# Patient Record
Sex: Female | Born: 1960 | Race: White | Hispanic: No | Marital: Married | Smoking: Former smoker
Health system: Southern US, Community
[De-identification: ages and names within clinical notes are randomized; demographics above are authoritative.]

## PROBLEM LIST (undated history)

## (undated) DIAGNOSIS — Z9889 Other specified postprocedural states: Secondary | ICD-10-CM

## (undated) DIAGNOSIS — L409 Psoriasis, unspecified: Secondary | ICD-10-CM

## (undated) DIAGNOSIS — C801 Malignant (primary) neoplasm, unspecified: Secondary | ICD-10-CM

## (undated) DIAGNOSIS — B1920 Unspecified viral hepatitis C without hepatic coma: Secondary | ICD-10-CM

## (undated) DIAGNOSIS — IMO0001 Reserved for inherently not codable concepts without codable children: Secondary | ICD-10-CM

## (undated) DIAGNOSIS — F419 Anxiety disorder, unspecified: Secondary | ICD-10-CM

## (undated) DIAGNOSIS — Z923 Personal history of irradiation: Secondary | ICD-10-CM

## (undated) DIAGNOSIS — IMO0002 Reserved for concepts with insufficient information to code with codable children: Secondary | ICD-10-CM

## (undated) DIAGNOSIS — I8500 Esophageal varices without bleeding: Secondary | ICD-10-CM

## (undated) DIAGNOSIS — R112 Nausea with vomiting, unspecified: Secondary | ICD-10-CM

## (undated) DIAGNOSIS — F32A Depression, unspecified: Secondary | ICD-10-CM

## (undated) DIAGNOSIS — C50919 Malignant neoplasm of unspecified site of unspecified female breast: Secondary | ICD-10-CM

## (undated) DIAGNOSIS — Z973 Presence of spectacles and contact lenses: Secondary | ICD-10-CM

## (undated) HISTORY — PX: BREAST LUMPECTOMY: SHX2

## (undated) HISTORY — DX: Unspecified viral hepatitis C without hepatic coma: B19.20

## (undated) HISTORY — PX: UPPER GI ENDOSCOPY: SHX6162

## (undated) HISTORY — DX: Anxiety disorder, unspecified: F41.9

## (undated) HISTORY — DX: Malignant (primary) neoplasm, unspecified: C80.1

## (undated) HISTORY — DX: Reserved for concepts with insufficient information to code with codable children: IMO0002

## (undated) HISTORY — PX: BREAST SURGERY: SHX581

## (undated) HISTORY — PX: OTHER SURGICAL HISTORY: SHX169

## (undated) HISTORY — DX: Reserved for inherently not codable concepts without codable children: IMO0001

## (undated) HISTORY — PX: EXCISION / BIOPSY BREAST / NIPPLE / DUCT: SUR469

## (undated) HISTORY — DX: Depression, unspecified: F32.A

---

## 1998-01-01 ENCOUNTER — Ambulatory Visit (HOSPITAL_COMMUNITY): Admission: RE | Admit: 1998-01-01 | Discharge: 1998-01-01 | Payer: Self-pay | Admitting: Obstetrics and Gynecology

## 1998-01-06 ENCOUNTER — Ambulatory Visit (HOSPITAL_COMMUNITY): Admission: RE | Admit: 1998-01-06 | Discharge: 1998-01-06 | Payer: Self-pay | Admitting: Obstetrics and Gynecology

## 1998-06-15 ENCOUNTER — Other Ambulatory Visit: Admission: RE | Admit: 1998-06-15 | Discharge: 1998-06-15 | Payer: Self-pay | Admitting: Obstetrics and Gynecology

## 1999-09-13 ENCOUNTER — Other Ambulatory Visit: Admission: RE | Admit: 1999-09-13 | Discharge: 1999-09-13 | Payer: Self-pay | Admitting: Obstetrics and Gynecology

## 1999-09-18 ENCOUNTER — Encounter: Payer: Self-pay | Admitting: Obstetrics and Gynecology

## 1999-09-18 ENCOUNTER — Inpatient Hospital Stay (HOSPITAL_COMMUNITY): Admission: AD | Admit: 1999-09-18 | Discharge: 1999-09-19 | Payer: Self-pay | Admitting: Obstetrics and Gynecology

## 1999-09-19 HISTORY — PX: TUBAL LIGATION: SHX77

## 1999-09-23 ENCOUNTER — Encounter (INDEPENDENT_AMBULATORY_CARE_PROVIDER_SITE_OTHER): Payer: Self-pay

## 1999-09-23 ENCOUNTER — Other Ambulatory Visit: Admission: RE | Admit: 1999-09-23 | Discharge: 1999-09-23 | Payer: Self-pay | Admitting: Obstetrics and Gynecology

## 1999-11-02 ENCOUNTER — Inpatient Hospital Stay (HOSPITAL_COMMUNITY): Admission: AD | Admit: 1999-11-02 | Discharge: 1999-11-02 | Payer: Self-pay | Admitting: Obstetrics & Gynecology

## 1999-11-10 ENCOUNTER — Inpatient Hospital Stay (HOSPITAL_COMMUNITY): Admission: AD | Admit: 1999-11-10 | Discharge: 1999-11-13 | Payer: Self-pay | Admitting: Obstetrics & Gynecology

## 1999-12-21 ENCOUNTER — Other Ambulatory Visit: Admission: RE | Admit: 1999-12-21 | Discharge: 1999-12-21 | Payer: Self-pay | Admitting: Obstetrics and Gynecology

## 2001-01-28 ENCOUNTER — Other Ambulatory Visit: Admission: RE | Admit: 2001-01-28 | Discharge: 2001-01-28 | Payer: Self-pay | Admitting: Obstetrics and Gynecology

## 2001-08-10 ENCOUNTER — Encounter: Admission: RE | Admit: 2001-08-10 | Discharge: 2001-08-10 | Payer: Self-pay | Admitting: Family Medicine

## 2001-08-10 ENCOUNTER — Encounter: Payer: Self-pay | Admitting: Family Medicine

## 2001-08-29 ENCOUNTER — Ambulatory Visit (HOSPITAL_COMMUNITY): Admission: RE | Admit: 2001-08-29 | Discharge: 2001-08-29 | Payer: Self-pay | Admitting: Obstetrics and Gynecology

## 2001-08-29 ENCOUNTER — Encounter (INDEPENDENT_AMBULATORY_CARE_PROVIDER_SITE_OTHER): Payer: Self-pay | Admitting: *Deleted

## 2002-03-13 ENCOUNTER — Other Ambulatory Visit: Admission: RE | Admit: 2002-03-13 | Discharge: 2002-03-13 | Payer: Self-pay | Admitting: Obstetrics and Gynecology

## 2003-03-30 ENCOUNTER — Other Ambulatory Visit: Admission: RE | Admit: 2003-03-30 | Discharge: 2003-03-30 | Payer: Self-pay | Admitting: Obstetrics and Gynecology

## 2003-07-30 ENCOUNTER — Ambulatory Visit (HOSPITAL_COMMUNITY): Admission: RE | Admit: 2003-07-30 | Discharge: 2003-07-30 | Payer: Self-pay | Admitting: Gastroenterology

## 2003-07-30 ENCOUNTER — Encounter (INDEPENDENT_AMBULATORY_CARE_PROVIDER_SITE_OTHER): Payer: Self-pay | Admitting: *Deleted

## 2004-05-02 ENCOUNTER — Other Ambulatory Visit: Admission: RE | Admit: 2004-05-02 | Discharge: 2004-05-02 | Payer: Self-pay | Admitting: Obstetrics and Gynecology

## 2004-09-27 ENCOUNTER — Encounter: Admission: RE | Admit: 2004-09-27 | Discharge: 2004-09-27 | Payer: Self-pay | Admitting: Obstetrics and Gynecology

## 2005-01-25 ENCOUNTER — Encounter (INDEPENDENT_AMBULATORY_CARE_PROVIDER_SITE_OTHER): Payer: Self-pay | Admitting: Specialist

## 2005-01-25 ENCOUNTER — Ambulatory Visit (HOSPITAL_COMMUNITY): Admission: RE | Admit: 2005-01-25 | Discharge: 2005-01-25 | Payer: Self-pay | Admitting: Gastroenterology

## 2005-04-04 ENCOUNTER — Ambulatory Visit: Payer: Self-pay | Admitting: Gastroenterology

## 2005-05-19 ENCOUNTER — Other Ambulatory Visit: Admission: RE | Admit: 2005-05-19 | Discharge: 2005-05-19 | Payer: Self-pay | Admitting: Obstetrics and Gynecology

## 2006-05-18 ENCOUNTER — Encounter: Admission: RE | Admit: 2006-05-18 | Discharge: 2006-05-18 | Payer: Self-pay | Admitting: Obstetrics and Gynecology

## 2006-06-28 ENCOUNTER — Encounter: Admission: RE | Admit: 2006-06-28 | Discharge: 2006-06-28 | Payer: Self-pay | Admitting: Obstetrics and Gynecology

## 2007-02-14 ENCOUNTER — Encounter: Admission: RE | Admit: 2007-02-14 | Discharge: 2007-02-14 | Payer: Self-pay | Admitting: Obstetrics and Gynecology

## 2007-06-27 ENCOUNTER — Other Ambulatory Visit: Admission: RE | Admit: 2007-06-27 | Discharge: 2007-06-27 | Payer: Self-pay | Admitting: Obstetrics and Gynecology

## 2008-06-09 ENCOUNTER — Encounter: Admission: RE | Admit: 2008-06-09 | Discharge: 2008-06-09 | Payer: Self-pay | Admitting: Obstetrics and Gynecology

## 2009-08-04 ENCOUNTER — Encounter: Admission: RE | Admit: 2009-08-04 | Discharge: 2009-08-04 | Payer: Self-pay | Admitting: Obstetrics and Gynecology

## 2009-08-17 ENCOUNTER — Encounter: Admission: RE | Admit: 2009-08-17 | Discharge: 2009-08-17 | Payer: Self-pay | Admitting: Obstetrics and Gynecology

## 2010-02-24 ENCOUNTER — Other Ambulatory Visit: Admission: RE | Admit: 2010-02-24 | Discharge: 2010-02-24 | Payer: Self-pay | Admitting: Internal Medicine

## 2010-09-02 ENCOUNTER — Encounter
Admission: RE | Admit: 2010-09-02 | Discharge: 2010-09-02 | Payer: Self-pay | Source: Home / Self Care | Attending: Obstetrics and Gynecology | Admitting: Obstetrics and Gynecology

## 2010-10-08 ENCOUNTER — Encounter: Payer: Self-pay | Admitting: Obstetrics and Gynecology

## 2010-10-09 ENCOUNTER — Encounter: Payer: Self-pay | Admitting: Obstetrics and Gynecology

## 2011-01-04 ENCOUNTER — Other Ambulatory Visit: Payer: Self-pay

## 2011-02-03 NOTE — Discharge Summary (Signed)
[facility]  Patient:    Connie [name], Connie [name]                        MRN: [id number] Adm. Date:  [id number] Disc. Date: [id number] Attending:  Minette Headland Dictator:   Danie Chandler, R.N.                           Discharge Summary  ADMISSION DIAGNOSES:          1. Intrauterine pregnancy at [redacted] weeks gestation.                               2. Surgically scarred uterus.                               3. Request repeat cesarean delivery.                               4. Multiparity.  DISCHARGE DIAGNOSES:          1. Intrauterine pregnancy at [redacted] weeks gestation.                               2. Surgically scarred uterus.                               3. Request repeat cesarean delivery.                               4. Multiparity.  PROCEDURE:                    On November 10, 1999, repeat low transverse cesarean section and bilateral tubal ligation.  REASON FOR ADMISSION:         The patient is a 50 year old married white female, gravida 3, para 1, who delivered the first time by cesarean section and was scheduled for repeat cesarean section on November 25, 1999.  The patient presented n labor at approximately 36 to 36-1/[redacted] weeks pregnant.  The only prenatal complication was that she acquired hepatitic C while pregnant.  HOSPITAL COURSE:              The patient is taken to the operating room and undergoes the above named procedure without complications.  This is productive f a viable female infant with Apgars of 8 at one minute and 9 at five minutes. Postoperatively, the patient does well.  On postoperative day #1, the patients hemoglobin was 11.9, hematocrit 35.4, and white blood cell count 16.4.  She had  good control of pain on this day.  Liver functions were followed during her hospitalization.  On November 11, 1999, SGOT was 95 and SGPT was 85.  This patient does have known hepatitis C.  On postoperative day #2, she had a good return  of  bowel function and was tolerating a regular diet and was also ambulating well without difficulty.  SGOT was 134 and SGPT was 103.  The patient remained stable and was discharged home on postoperative day #3.  CONDITION ON DISCHARGE:  Good.  DIET:                         Regular as tolerated.  ACTIVITY:                     No heavy lifting, no driving, and no vaginal entry.  FOLLOW-UP:                    She is to follow up in one to two weeks for incision check and she is to call for a temperature greater than 100 degrees, persistent  nausea and vomiting, heavy vaginal bleeding, and/or redness or drainage from the incision site.  She is also to follow up with Verlin Grills, M.D. in relation  to hepatitis C.  DISCHARGE MEDICATIONS:        1. Prenatal vitamins one p.o. q.d.                               2. Demerol 50 mg one to two p.o. q.4-6h. p.r.n. pain. DD:  11/23/99 TD:  11/24/99 Job: [id number]

## 2011-02-03 NOTE — H&P (Signed)
[facility]  PatientAmethyst [name], [place] Visit Number: [id number] MRN: [id number]          Service Type: Attending:  Guy Sandifer. Arleta Creek, M.D. Dictated by:   Guy Sandifer Arleta Creek, M.D. Adm. Date:  08/29/01                           History and Physical  CHIEF COMPLAINT:              Irregular menses.  HISTORY OF PRESENT ILLNESS:   This patient is a 50 year old married white female, G3, P3, status post tubal ligation, with increasing irregular menses, sometimes having two in one months.  On physical examination, uterus is normal size and nontender.  On pelvic ultrasound on August 23, 2001, the uterus measured 7.79 x 3.63 x 5.29 cm.  One intramural leiomyoma measuring 1.5 cm is noted.  The ovaries are normal.  Sonohistogram reveals a polypoid-type structure within the endometrial cavity.  Hysteroscopy with resectoscope and D&C is discussed with the patient.  She is being admitted for this surgery.  PAST MEDICAL HISTORY:         1.  Hepatitis C, taken care of by                                   Dr. Danise Edge.                               2.  History of vulvar condyloma.                               3.  History of psoriasis.  PAST SURGICAL HISTORY:        Negative.  OBSTETRIC HISTORY:            Cesarean section x 2, TAB x 1.  FAMILY HISTORY:               Cardiomegaly in mother, coronary artery disease in father, chronic hypertension in mother and father, diabetes in father, thyroid dysfunction in mother.  MEDICATIONS:                  None.  ALLERGIES:                    CODEINE leading to nausea and vomiting.  SOCIAL HISTORY:               Patient has recently stopped smoking.  Denies alcohol or drug abuse.  REVIEW OF SYSTEMS:            Negative, except as above.  PHYSICAL EXAMINATION:  VITAL SIGNS:                  Height is 5 feet 6 inches, weight is 151 pounds. Blood pressure 110/68.  HEENT:                        Without  thyromegaly.  LUNGS:                        Clear to auscultation.  HEART:  Regular rate and rhythm.  BACK:                         Without CVA tenderness.  BREASTS:                      Without mass, retraction, or discharge.  ABDOMEN:                      Soft and nontender, without masses.  PELVIC:                       Vulva, vagina, and cervix without lesions. Uterus normal size and nontender.  Adnexa nontender, without masses.  EXTREMITIES:                  Grossly within normal limits.  NEUROLOGICAL:                 Grossly within normal limits.  ASSESSMENT:                   Irregular menses.  PLAN:                         Hysteroscopy, resectoscope, D&C. Dictated by:   Guy Sandifer Arleta Creek, M.D. Attending:  Guy Sandifer Arleta Creek, M.D. DD:  08/28/01 TD:  08/28/01 Job: [id number]

## 2011-02-03 NOTE — Discharge Summary (Signed)
[facility]  Patient:    Connie [name], Connie [name]                        MRN: [id number] Adm. Date:  [id number] Disc. Date: [id number] Attending:  Minette Headland CC:         [facility]             Verlin Grills, M.D.                           Discharge Summary  HISTORY OF PRESENT ILLNESS:   Patient is a 50 year old gravida 3, para 1, TAB 1, white female, EDC December 06, 1999, 28-5/7 weeks.  Patient was seen in the office the week prior to admission complaining of heartburn.  She manifested no preeclampsia symptoms.  Preeclampsia laboratory studies were performed, but the laboratory could not locate them.  They were ultimately able to locate them on the day of admission, September 17, 1999.  The patient had been in the computer by a different name. er liver function tests on December 22 revealed an SGOT of 66 and SGPT of 93.  The  patient was urged to come to maternity admissions for evaluation.  At maternity  admissions liver function tests remained elevated at SGOT of 73 and SGPT of 92.  The patient had manifested severe heartburn the day of admission accompanied by  nausea and vomiting.  She denied any other preeclampsia symptoms.  She did have a cesarean section in 1989 and did not have preeclampsia, but this is a different  father.  With respect to gastroenterological reviews patient has well water.  She has eaten no raw seafood or meat.  She did have some tattoos placed six years previously.  She denied IV drug use or blood transfusion.  She also denies any viral symptoms. History of two beers per day prior to pregnancy, no alcohol since having a positive pregnancy test.  Patient did have a bowel movement today, has taken no Tylenol or the last three days.  PAST MEDICAL HISTORY:         TAB in 1976, cesarean section in 1989, history of  psoriasis.  She had been treated for psoriasis with methotrexate but has  not taken this for years.  MEDICATIONS:                  Prenatal vitamins.  ALLERGIES:                    CODEINE causes nausea and nightmares.  SOCIAL HISTORY:               The patient is a Merchandiser, retail at [employer] and is a one half pack per day smoker.  PHYSICAL EXAMINATION:  GENERAL:                      Well-developed, well-nourished white female in no  acute distress.  VITAL SIGNS:                  Blood pressure 111/67.  LUNGS:                        Clear to auscultation.  CARDIAC:                      Regular rate and rhythm.  ABDOMEN:  Fundus nontender.  Abdomen soft, nontender.  No hepatosplenomegaly.  No right upper quadrant tenderness.  EXTREMITIES:                  DTRs 1+, no clonus.  There was noted to be a trace of pedal edema.  PELVIC:                       Pelvic examination was deferred.  LABORATORY STUDIES:           White count 12,200, hemoglobin 11.7, hematocrit 33.8, 257,000 platelet count.  SGOT was 73, SGPT 92, LDH 143, uric acid 3.9, urinalysis negative for protein.  HOSPITAL COURSE:              The patient was admitted with elevated liver function tests with no additional symptoms of preeclampsia.  The patient was admitted to  rule out preeclampsia and had a 24 hour urine for protein and creatinine clearance performed.  It was this physicians clinical suspicion that the patients primary  problem was hepatic and not preeclampsia, so an acute hepatitis profile was drawn. In addition, lipase and amylase were also performed.  Right upper quadrant ultrasound to rule out cholelithiasis.  Patient subsequently underwent an ultrasound for fetal growth.  The ultrasound showed normal fetal growth with normal fluid and normal umbilical Doppler studies.  Right upper quadrant ultrasound was also negative.  Lipase returned as normal at 32, amylase was 37.  Twenty-four hour urine revealed a creatinine clearance of 210  ml/minute which is normal in pregnancy.  There were 51 mg in a 24 hour collection which was not proteinuric.  Repeat hemoglobin was 11.4, platelet count was 263,000.  SGOT was 68 and SGPT was 89.  Uric acid was 2.8 and LDH was 130.  The acute hepatitis profile returned and the patient was noted to have positive anti HCV, Hepatitis C antibody.  The patients anti AJV was negative, HB core IGM was negative, and HBSAG was negative. The patient was discussed with Dr. Danise Edge, gastroenterologist on call for [facility].  He suggested a confirmatory quantitative HCVRNA for _________ chain reaction to assess her viral load.  Patient was subsequently discharged home, urged to follow up in the office.  She was made aware that if  confirmatory quantitative HCVRNA _________ chain reaction were positive, that she would be referred to Dr. Laural Benes for further evaluation and management. Patient was very amenable with this plan.  She was subsequently discharged home on September 19, 1999. DD:  11/20/99 TD:  11/21/99 Job: [id number]

## 2011-02-03 NOTE — Op Note (Signed)
[facility]  Patient:    Connie [name], Connie [name] [facility] Visit Number: [id number] MRN: [id number]          Service Type: DSU Location: [facility] Attending Physician:  Soledad Gerlach Dictated by:   Guy Sandifer Arleta Creek, M.D. Proc. Date: 08/29/01 Admit Date:  08/29/2001                             Operative Report  PREOPERATIVE DIAGNOSES:       Irregular menses.  POSTOPERATIVE DIAGNOSES:      Irregular menses.  PROCEDURE:                    Hysteroscopy, dilatation and curettage.  SURGEON:                      Guy Sandifer. Arleta Creek, M.D.  ANESTHESIA:                   General with LMA.  ESTIMATED BLOOD LOSS:         Drops.  INPUT AND OUTPUT:             Sorbitol distending media 40 cc deficit.  INDICATIONS AND CONSENTS:     Patient is a 50 year old married white female G3, P3 status post tubal ligation with increasingly irregular menses.  Details are dictated in the history and physical.  Hysteroscopy with D&C is discussed with the patient.  Potential risks and complications have been discussed including, but not limited to, infection, uterine perforation, organ damage, bowel, bladder, ureteral damage, bleeding requiring transfusion of blood products with possible transfusion reaction, HIV and hepatitis acquisition, DVT, PE, pneumonia, laparoscopy, laparotomy, and hysterectomy.  All questions have been answered and consent is signed on the chart.  FINDINGS:                     The uterine cavity is normal.  No abnormal structures or vessels are noted.  Fallopian tube and ostia are identified bilaterally.  PROCEDURE:                    Patient was taken to the operating room, placed in dorsal supine position where general anesthesia via LMA is induced.  She is then placed in the dorsal lithotomy position where she is prepped, bladder straight catheterized, and she is draped in a sterile fashion.  Vivelle speculum was placed in the vagina.  The anterior  cervical lip is injected with 1% Xylocaine and grasped with a single tooth tenaculum.  Paracervical block was placed at the 2, 4, 5, 7, 8, 10 oclock positions with 1% Xylocaine. Cervix is gently, progressively dilated to a 29 Pratt dilator.  Diagnostic hysteroscope is then placed in the cervix and advanced under direct visualization using sorbitol distending media.  The above findings are noted. The hysteroscope is withdrawn and sharp curettage is carried out. Re-inspection with the hysterscope again reveals no abnormal structures. Procedure is terminated.  Instruments are removed.  Good hemostasis is noted. All counts are correct.  The patient is awakened, taken to recovery room in stable condition. Dictated by:   Guy Sandifer Arleta Creek, M.D. Attending Physician:  Soledad Gerlach DD:  08/29/01 TD:  08/29/01 Job: [id number]

## 2011-02-03 NOTE — Op Note (Signed)
   NAME:  Connie [name], Connie [name]                            ACCOUNT NO.:  [id number]   MEDICAL RECORD NO.:  [id number]                   PATIENT TYPE:  AMB   LOCATION:  ENDO                                 FACILITY:  [facility]   PHYSICIAN:  Danise Edge, M.D.                DATE OF BIRTH:  [birth year]   DATE OF PROCEDURE:  07/30/2003  DATE OF DISCHARGE:                                 OPERATIVE REPORT   PROCEDURE:  Colonoscopy.   INDICATIONS FOR PROCEDURE:  Ms. Connie [name] is a 50 year old female born 06-12-1961.  Ms. Connie [name] is undergoing diagnostic colonoscopy to evaluate  intermittent pain with hematochezia.  Ms. Connie [name] has asymptomatic viral  hepatitis [name] (type 1A).  On January 04, 2001, her liver profile revealed less  than 2 elevation in her transaminases.   ENDOSCOPIST:  Charolett Bumpers, M.D.   PREMEDICATION:  Versed 10 mg, Fentanyl 100 mcg.   PROCEDURE:  After obtaining confirmed consent, Connie [name] was placed in the  left lateral decubitus position.  I administered intravenous Fentanyl and  intravenous Versed to achieve conscious sedation for the procedure.  The  patient's blood pressure, oxygen saturation, and cardiac rhythm were  monitored throughout the procedure and documented in the medical record.  Anal inspection was normal.  Digital rectal exam was normal.  The Olympus  pediatric video colonoscope was introduced into the rectum and advanced to  the cecum.  Colonic preparation for the exam today was excellent.   Rectum normal.  Sigmoid colon and ascending colon:  From the distal sigmoid colon at  approximately 15 cm from the anal verge, a 2 cm pedunculated polyp was  removed with electrocautery snare and submitted for pathologic  interpretation.  Splenic flexure normal.  Transverse colon normal.  Hepatic flexure normal.  Ascending colon normal.  Cecum and ileocecal valve normal.    ASSESSMENT:  In the distal sigmoid colon, a 2 cm pedunculated polyp was  removed with  electrocautery snare and submitted for pathologic  interpretation.   RECOMMENDATIONS:  Liver profile is drawn in the [facility] endoscopy suite  today.                                               Danise Edge, M.D.    MJ/MEDQ  D:  07/30/2003  T:  07/30/2003  Job:  [id number]   cc:   Guy Sandifer. Arleta Creek, M.D.  [address]  [place]  [place] [address]  Fax: [phone]

## 2011-11-22 ENCOUNTER — Encounter: Payer: Self-pay | Admitting: Family Medicine

## 2011-11-22 ENCOUNTER — Ambulatory Visit (INDEPENDENT_AMBULATORY_CARE_PROVIDER_SITE_OTHER): Payer: BC Managed Care – PPO | Admitting: Family Medicine

## 2011-11-22 DIAGNOSIS — B1920 Unspecified viral hepatitis C without hepatic coma: Secondary | ICD-10-CM | POA: Insufficient documentation

## 2011-11-22 DIAGNOSIS — R682 Dry mouth, unspecified: Secondary | ICD-10-CM

## 2011-11-22 DIAGNOSIS — Z23 Encounter for immunization: Secondary | ICD-10-CM

## 2011-11-22 DIAGNOSIS — L409 Psoriasis, unspecified: Secondary | ICD-10-CM | POA: Insufficient documentation

## 2011-11-22 DIAGNOSIS — Z0000 Encounter for general adult medical examination without abnormal findings: Secondary | ICD-10-CM

## 2011-11-22 DIAGNOSIS — Z780 Asymptomatic menopausal state: Secondary | ICD-10-CM

## 2011-11-22 LAB — CBC WITH DIFFERENTIAL/PLATELET
Basophils Absolute: 0.1 10*3/uL (ref 0.0–0.1)
Basophils Relative: 1 % (ref 0–1)
Eosinophils Absolute: 0.1 10*3/uL (ref 0.0–0.7)
Hemoglobin: 15.4 g/dL — ABNORMAL HIGH (ref 12.0–15.0)
MCH: 32.3 pg (ref 26.0–34.0)
MCHC: 33.3 g/dL (ref 30.0–36.0)
Monocytes Relative: 10 % (ref 3–12)
Neutro Abs: 4.4 10*3/uL (ref 1.7–7.7)
Neutrophils Relative %: 57 % (ref 43–77)
Platelets: 165 10*3/uL (ref 150–400)
RDW: 14.3 % (ref 11.5–15.5)

## 2011-11-22 LAB — POCT URINALYSIS DIPSTICK
Ketones, UA: NEGATIVE
Protein, UA: NEGATIVE
Spec Grav, UA: 1.025
pH, UA: 6.5

## 2011-11-22 NOTE — Patient Instructions (Addendum)
Menopause Menopause is the normal time of life when menstrual periods stop completely. Menopause is complete when you have missed 12 consecutive menstrual periods. It usually occurs between the ages of 48 to 55, with an average age of 51. Very rarely does a woman develop menopause before 51 years old. At menopause, your ovaries stop producing the female hormones, estrogen and progesterone. This can cause undesirable symptoms and also affect your health. Sometimes the symptoms may occur 4 to 5 years before the menopause begins. There is no relationship between menopause and:  Oral contraceptives.   Number of children you had.   Race.   The age your menstrual periods started (menarche).  Heavy smokers and very thin women may develop menopause earlier in life. CAUSES  The ovaries stop producing the female hormones estrogen and progesterone.   Other causes include:   Surgery to remove both ovaries.   The ovaries stop functioning for no known reason.   Tumors of the pituitary gland in the brain.   Medical disease that affects the ovaries and hormone production.   Radiation treatment to the abdomen or pelvis.   Chemotherapy that affects the ovaries.  SYMPTOMS   Hot flashes.   Night sweats.   Decrease in sex drive.   Vaginal dryness and thinning of the vagina causing painful intercourse.   Dryness of the skin and developing wrinkles.   Headaches.   Tiredness.   Irritability.   Memory problems.   Weight gain.   Bladder infections.   Hair growth of the face and chest.   Infertility.  More serious symptoms include:  Loss of bone (osteoporosis) causing breaks (fractures).   Depression.   Hardening and narrowing of the arteries (atherosclerosis) causing heart attacks and strokes.  DIAGNOSIS   When the menstrual periods have stopped for 12 straight months.   Physical exam.   Hormone studies of the blood.  TREATMENT  There are many treatment choices and nearly  as many questions about them. The decisions to treat or not to treat menopausal changes is an individual choice made with your caregiver. Your caregiver can discuss the treatments with you. Together, you can decide which treatment will work best for you. Your treatment choices may include:   Hormone therapy (estorgen and progesterone).   Non-hormonal medications.   Treating the individual symptoms with medication (for example antidepressants for depression).   Herbal medications that may help specific symptoms.   Counseling by a psychiatrist or psychologist.   Group therapy.   Lifestyle changes including:   Eating healthy.   Regular exercise.   Limiting caffeine and alcohol.   Stress management and meditation.   No treatment.  HOME CARE INSTRUCTIONS   Take the medication your caregiver gives you as directed.   Get plenty of sleep and rest.   Exercise regularly.   Eat a diet that contains calcium (good for the bones) and soy products (acts like estrogen hormone).   Avoid alcoholic beverages.   Do not smoke.   If you have hot flashes, dress in layers.   Take supplements, calcium and vitamin D to strengthen bones.   You can use over-the-counter lubricants or moisturizers for vaginal dryness.   Group therapy is sometimes very helpful.   Acupuncture may be helpful in some cases.  SEEK MEDICAL CARE IF:   You are not sure you are in menopause.   You are having menopausal symptoms and need advice and treatment.   You are still having menstrual periods after age 55.     You have pain with intercourse.   Menopause is complete (no menstrual period for 12 months) and you develop vaginal bleeding.   You need a referral to a specialist (gynecologist, psychiatrist or psychologist) for treatment.  SEEK IMMEDIATE MEDICAL CARE IF:   You have severe depression.   You have excessive vaginal bleeding.   You fell and think you have a broken bone.   You have pain when you  urinate.   You develop leg or chest pain.   You have a fast pounding heart beat (palpitations).   You have severe headaches.   You develop vision problems.   You feel a lump in your breast.   You have abdominal pain or severe indigestion.  Document Released: 11/25/2003 Document Revised: 08/24/2011 Document Reviewed: 07/02/2008 ExitCare Patient Information 2012 ExitCare, [facility]    Sore or Dry Mouth Care A sore or dry mouth may happen for many different reasons. Sometimes, treatment for other health problems may have to stop until your sore or dry mouth gets better.  HOME CARE  Do not smoke or chew tobacco.   Use fake (artificial) saliva when your mouth feels dry.   Use a humidifier in your bedroom at night.   Eat small meals and snacks.   Eat food cold or at room temperature.   Suck on ice-chips or try frozen ice pops or juice bars, ice-cream, and watermelon. Do not have citrus flavors.   Suck on hard, sugarless, sour candy, or chew sugarless gum to help make more saliva.   Eat soft foods such as yogurt, bananas, canned fruit, mashed potatoes, oatmeal, rice, eggs, cottage cheese, macaroni and cheese, jello, and pudding.   Microwave vegetables and fruits to soften them.   Puree cooked food in a blender if needed.   Make dry food moist by using olive oil, gravy, or mild sauces. Dip foods in liquids.   Keep a glass of water or squirt bottle nearby. Take sips often throughout the day.   Limit caffeine.   Avoid:   Pop or fizzy drinks.   Alcohol.   Citrus juices.   Acidic food.   Salty or spicy food.   Foods or drinks that are very hot.   Hard or crunchy food.  Mouth Care  Wash your hands well with soap and water before doing mouth care.   Use fake saliva as told by your doctor.   Use medicine on the sore places.   Brush your teeth at least 2 times a day. Brush after each meal if possible. Rinse your mouth with water after each meal and after drinking a  sweet drink.   Brush slowly and gently in small circles. Do not brush side-to-side.   Use regular toothpastes, but stay away from ones that have sodium laurel sulfate in them.   Gargle with a baking soda mouthwash ( teaspoon baking soda mixed in with 4 cups of water).   Gargle with medicated mouthwash.   Use dental floss or dental tape to clean between your teeth every day.   Use a lanolin-based lip balm to keep your lips from getting dry.   If you wear dentures or bridges:   You may need to leave them out until your doctor tells you to start wearing them again.   Take them out at night if you wear them daily. Soak them in warm water or denture solution. Take your dentures out as much as you can during the day. Take them out when you use mouthwash.  After each meal, brush your gums gently with a soft brush and rinse your mouth with water.   If your dentures rub on your gums and cause a sore spot, have your dentist check and fix your dentures right away.  GET HELP RIGHT AWAY IF:   Your mouth gets more painful or dry.   You have questions.  MAKE SURE YOU:  Understand these instructions.   Will watch your condition.   Will get help right away if you are not doing well or get worse.  Document Released: 07/02/2009 Document Revised: 08/24/2011 Document Reviewed: 07/02/2009 [facility] Patient Information 2012 [place], [place]     .Marland KitchenKeeping You Healthy  Get These Tests  Blood Pressure- Have your blood pressure checked by your healthcare provider at least once a year.  Normal blood pressure is 120/80.  Weight- Have your body mass index (BMI) calculated to screen for obesity.  BMI is a measure of body fat based on height and weight.  You can calculate your own BMI at https://www.west-esparza.com/  Cholesterol- Have your cholesterol checked every year.  Diabetes- Have your blood sugar checked every year if you have high blood pressure, high cholesterol, a family history of diabetes  or if you are overweight.  Pap Smear- Have a pap smear every 1 to 3 years if you have been sexually active.  If you are older than 65 and recent pap smears have been normal you may not need additional pap smears.  In addition, if you have had a hysterectomy  For benign disease additional pap smears are not necessary.  Mammogram-Yearly mammograms are essential for early detection of breast cancer  Screening for Colon Cancer- Colonoscopy starting at age 27. Screening may begin sooner depending on your family history and other health conditions.  Follow up colonoscopy as directed by your Gastroenterologist.  Screening for Osteoporosis- Screening begins at age 1 with bone density scanning, sooner if you are at higher risk for developing Osteoporosis.  Get these medicines  Calcium with Vitamin D- Your body requires 1200-1500 mg of Calcium a day and [phone] IU of Vitamin D a day.  You can only absorb 500 mg of Calcium at a time therefore Calcium must be taken in 2 or 3 separate doses throughout the day.  Hormones- Hormone therapy has been associated with increased risk for certain cancers and heart disease.  Talk to your healthcare provider about if you need relief from menopausal symptoms.  Aspirin- Ask your healthcare provider about taking Aspirin to prevent Heart Disease and Stroke.  Get these Immuniztions  Flu shot- Every fall  Pneumonia shot- Once after the age of 64; if you are younger ask your healthcare provider if you need a pneumonia shot.  Tetanus- Every ten years.  Zostavax- Once after the age of 84 to prevent shingles.  Take these steps  Don't smoke- Your healthcare provider can help you quit. For tips on how to quit, ask your healthcare provider or go to www.smokefree.gov or call 1-800 QUIT-NOW.  Be physically active- Exercise 5 days a week for a minimum of 30 minutes.  If you are not already physically active, start slow and gradually work up to 30 minutes of moderate  physical activity.  Try walking, dancing, bike riding, swimming, etc.  Eat a healthy diet- Eat a variety of healthy foods such as fruits, vegetables, whole grains, low fat milk, low fat cheeses, yogurt, lean meats, chicken, fish, eggs, dried beans, tofu, etc.  For more information go to www.thenutritionsource.org  Dental  visit- Brush and floss teeth twice daily; visit your dentist twice a year.  Eye exam- Visit your Optometrist or Ophthalmologist yearly.  Drink alcohol in moderation- Limit alcohol intake to one drink or less a day.  Never drink and drive.  Depression- Your emotional health is as important as your physical health.  If you're feeling down or losing interest in things you normally enjoy, please talk to your healthcare provider.  Seat Belts- can save your life; always wear one  Smoke/Carbon Monoxide detectors- These detectors need to be installed on the appropriate level of your home.  Replace batteries at least once a year.  Violence- If anyone is threatening or hurting you, please tell your healthcare provider.  Living Will/ Health care power of attorney- Discuss with your healthcare provider and family.

## 2011-11-22 NOTE — Progress Notes (Signed)
  Subjective:    Patient ID: Connie [name], female    DOB: [birth year], 51 y.o.   MRN: [id number]  HPI  This 51 y.o. Cauc female is here for CPE; she will have PAP performed later this year at GYN  office. She has chronic issues with dry mouth and menopause symptoms on and off for 5 years. She  has Hep C which is being monitored with labs but has declined treatment; she sees Dr. Reece Agar-  GI specialist- as needed.  She is a pt with Dr. Dorita Sciara practice for treatment of psoriasis. She voices  her dislike of having to take any kind of prescription medications.   Review of Systems  Constitutional: Negative.   HENT: Negative.   Eyes: Negative.   Respiratory: Negative.   Cardiovascular: Negative.   Gastrointestinal: Negative.   Genitourinary: Negative.   Musculoskeletal: Negative.   Skin: Positive for rash.       Diffuse psoriatic rash (irreg red plaques) primarily on extremities  Neurological: Negative.   Hematological: Negative.   Psychiatric/Behavioral: Positive for sleep disturbance.       Objective:   Physical Exam  Constitutional: She is oriented to person, place, and time. She appears well-developed and well-nourished. No distress.  HENT:  Head: Normocephalic and atraumatic.  Right Ear: External ear normal.  Left Ear: External ear normal.  Nose: Nose normal.  Mouth/Throat: Oropharynx is clear and moist.  Eyes: Conjunctivae and EOM are normal. No scleral icterus.  Neck: Normal range of motion. Neck supple. No thyromegaly present.  Cardiovascular: Normal rate, regular rhythm, normal heart sounds and intact distal pulses.  Exam reveals no gallop and no friction rub.   No murmur heard. Pulmonary/Chest: Effort normal and breath sounds normal. No respiratory distress. Right breast exhibits no inverted nipple, no mass and no skin change. Left breast exhibits no inverted nipple, no mass, no nipple discharge, no skin change and no tenderness. Breasts are symmetrical.  Abdominal:  Soft. Bowel sounds are normal. She exhibits no distension and no mass. There is no tenderness.  Musculoskeletal: Normal range of motion.  Lymphadenopathy:    She has no cervical adenopathy.  Neurological: She is alert and oriented to person, place, and time. She has normal reflexes. No cranial nerve deficit. Coordination normal.  Skin: Skin is warm and dry.  Psychiatric: She has a normal mood and affect. Her behavior is normal. Judgment and thought content normal.    Results for orders placed in visit on 11/22/11  POCT URINALYSIS DIPSTICK      Component Value Range   Color, UA yellow     Clarity, UA clear     Glucose, UA neg     Bilirubin, UA small     Ketones, UA neg     Spec Grav, UA 1.025     Blood, UA neg     pH, UA 6.5     Protein, UA neg     Urobilinogen, UA 2.0     Nitrite, UA neg     Leukocytes, UA Negative           Assessment & Plan:   1. Routine general medical examination at a health care facility  Encouraged to keep appt for PAP/GYN exam  2. Hepatitis C  CMET; this is being monitored by GI specialist also5.  3. Menopause  May try OTC supplement (i.e. Estroven)  4. Dry mouth  Conservative treatment directed at symptom reduction   5.  Zostavax Rx given to pt

## 2011-11-23 LAB — LIPID PANEL
HDL: 48 mg/dL (ref >39)
LDL Cholesterol: 126 mg/dL — ABNORMAL HIGH (ref 0–99)
Triglycerides: 90 mg/dL (ref <150)
VLDL: 18 mg/dL (ref 0–40)

## 2011-11-23 LAB — COMPREHENSIVE METABOLIC PANEL
AST: 346 U/L — ABNORMAL HIGH (ref 0–37)
Alkaline Phosphatase: 154 U/L — ABNORMAL HIGH (ref 39–117)
Glucose, Bld: 85 mg/dL (ref 70–99)
Sodium: 141 mEq/L (ref 135–145)
Total Bilirubin: 1.5 mg/dL — ABNORMAL HIGH (ref 0.3–1.2)
Total Protein: 7.2 g/dL (ref 6.0–8.3)

## 2011-11-23 LAB — VITAMIN D 25 HYDROXY (VIT D DEFICIENCY, FRACTURES): Vit D, 25-Hydroxy: 35 ng/mL (ref 30–89)

## 2011-11-27 NOTE — Progress Notes (Signed)
Quick Note:  Please call pt and advise that the following labs are abnormal... Her Liver function tests are very elevated as would be expected with Hepatitis C (pt is aware of this diagnosis). She has an established relationship with GI Specialist, Dr. Reece Agar; I recommend she schedule a follow-up with him in the near future.   All other labs are normal.  Please provide her with a copy of results. ______

## 2011-11-29 ENCOUNTER — Telehealth: Payer: Self-pay

## 2011-11-29 NOTE — Telephone Encounter (Signed)
PATIENT IS RETURNING OUR CALL FOR LAB RESULTS PLEASE CALL PT ON HER CELL PHONE: [phone]

## 2011-11-30 NOTE — Telephone Encounter (Signed)
Pt was given lab results today. (see result note on labs)

## 2012-01-01 ENCOUNTER — Telehealth: Payer: Self-pay

## 2012-01-01 DIAGNOSIS — B182 Chronic viral hepatitis C: Secondary | ICD-10-CM

## 2012-01-01 NOTE — Telephone Encounter (Signed)
Pt would like a referral to hep clinic

## 2012-01-01 NOTE — Telephone Encounter (Signed)
Dr. Audria Nine, please see the pt request below to be referred to Hepatitis Clinic.  Pt has a Dr. Reece Agar as GI, but would like to refer based on her recent lab results??

## 2012-01-03 NOTE — Telephone Encounter (Signed)
Referral to GI Reece Agar, M.D.) completed.

## 2012-01-04 ENCOUNTER — Telehealth: Payer: Self-pay | Admitting: *Deleted

## 2012-01-04 NOTE — Telephone Encounter (Signed)
We  don't treat patients for Hepatitis B and C, they are all referred to Hepatitis clinic.That decision was made at least 10 years ago by the GI division.Sorry. Please let Dr Eden Emms know .

## 2012-01-04 NOTE — Telephone Encounter (Signed)
Patient emailed our office stating "Dr Eden Emms referred me to Dr Juanda Chance. Do I need a referral to make an appointment?"  Upon review of patient's chart, it appears that she was diagnosed with Hepatitis C in the past and has been followed by Dr Danise Edge. I contacted Dr Henriette Combs office and was told that the last time patient was seen was for a procedure 07/2008. Patient had a liver biopsy in 2006 but apparently declined any treatment for Hepatitis C at that time as she was pregnant. I contacted the patient and she states that recently, her LFT's were found to be very elevated and she feels like she needs to follow up. She contacted Dr Henriette Combs office and relays that they advised her they had no record of her ever having a liver biopsy etc. Also states that they will not return her calls. I have advised her that although Dr Juanda Chance is a GI Dr, we [place] usually follow people for Hepatitis C but rather send them to [facility] for treatment. Patient states that she went to [facility] several years back but did not care for the physician she saw. Again, I discussed with patient the fact that we do not follow individuals for Hepatitis C but I would send her a release of information to fill out so we can obtain her records for Dr Juanda Chance to review for general GI care for other GI issues. Patient states that she is willing to go to [facility] but just wants "someone to please help me." We will await records and let Dr Juanda Chance decide regarding general GI care if she desires. Of note, patient has been made aware that Dr Juanda Chance may or may not decide to accept her and she verbalizes understanding. Patient apparently talked to Dr Eden Emms when she was at a visit with her husband in his office.

## 2012-01-05 ENCOUNTER — Telehealth: Payer: Self-pay | Admitting: Cardiovascular Disease

## 2012-01-05 ENCOUNTER — Encounter: Payer: Self-pay | Admitting: *Deleted

## 2012-01-05 NOTE — Telephone Encounter (Signed)
New problem:  Per Rene Kocher calling from Dr. Lina Sar office. Patient has a H/O hep C. The office Decided about 10 years  Ago  not to see patient with Hep C & B. They are referral to  Hep  Clinic . Office number is [phone].

## 2012-01-05 NOTE — Telephone Encounter (Signed)
Called Dr. Fabio Bering office and left a message for his nurse about our office policy and left phone number for Hept. Clinic.

## 2012-01-05 NOTE — Telephone Encounter (Signed)
PHONED PT AND MADE HER AWARE OF PHONE NUMBER FOR HEPATITIS CLINIC AT [place]--PT STATED SHE WOULD CALL THEM--NT

## 2012-01-05 NOTE — Telephone Encounter (Signed)
error 

## 2012-01-08 NOTE — Telephone Encounter (Signed)
Patient states that actually, she has since spoken to a physician in [place], [place] who will be doing a full work up on her for elevated LFT's and Hepatitis C.

## 2012-02-29 ENCOUNTER — Ambulatory Visit (INDEPENDENT_AMBULATORY_CARE_PROVIDER_SITE_OTHER): Payer: BC Managed Care – PPO | Admitting: Emergency Medicine

## 2012-02-29 VITALS — BP 126/80 | HR 72 | Temp 98.4°F | Resp 18 | Ht 66.0 in | Wt 157.0 lb

## 2012-02-29 DIAGNOSIS — H1030 Unspecified acute conjunctivitis, unspecified eye: Secondary | ICD-10-CM

## 2012-02-29 MED ORDER — CIPROFLOXACIN HCL 0.3 % OP SOLN: 1.0000 [drp] | OPHTHALMIC | Status: AC

## 2012-02-29 NOTE — Patient Instructions (Signed)
Conjunctivitis Conjunctivitis is commonly called "pink eye." Conjunctivitis can be caused by bacterial or viral infection, allergies, or injuries. There is usually redness of the lining of the eye, itching, discomfort, and sometimes discharge. There may be deposits of matter along the eyelids. A viral infection usually causes a watery discharge, while a bacterial infection causes a yellowish, thick discharge. Pink eye is very contagious and spreads by direct contact. You may be given antibiotic eyedrops as part of your treatment. Before using your eye medicine, remove all drainage from the eye by washing gently with warm water and cotton balls. Continue to use the medication until you have awakened 2 mornings in a row without discharge from the eye. Do not rub your eye. This increases the irritation and helps spread infection. Use separate towels from other household members. Wash your hands with soap and water before and after touching your eyes. Use cold compresses to reduce pain and sunglasses to relieve irritation from light. Do not wear contact lenses or wear eye makeup until the infection is gone. SEEK MEDICAL CARE IF:   Your symptoms are not better after 3 days of treatment.   You have increased pain or trouble seeing.   The outer eyelids become very red or swollen.  Document Released: 10/12/2004 Document Revised: 08/24/2011 Document Reviewed: 09/04/2005 Exit[employer] Patient Information 2012 [employer]. 

## 2012-02-29 NOTE — Progress Notes (Signed)
  Subjective:    Patient ID: Connie [name], female    DOB: 1960-11-01, 51 y.o.   MRN: [id number]  Conjunctivitis  The current episode started today. The problem occurs rarely. The problem has been unchanged. The problem is moderate. Nothing relieves the symptoms. Nothing aggravates the symptoms. Associated symptoms include eye itching, eye discharge and eye redness. Pertinent negatives include no orthopnea, no fever, no decreased vision, no double vision, no photophobia, no abdominal pain, no constipation, no diarrhea, no nausea, no vomiting, no congestion, no ear discharge, no ear pain, no headaches, no hearing loss, no mouth sores, no rhinorrhea, no sore throat, no stridor, no swollen glands, no muscle aches, no neck pain, no cough, no URI, no wheezing, no rash, no diaper rash and no eye pain. She has been behaving normally. She has been eating and drinking normally. There were no sick contacts. She has received no recent medical care.      Review of Systems  Constitutional: Negative.  Negative for fever.  HENT: Negative for hearing loss, ear pain, congestion, sore throat, rhinorrhea, mouth sores, neck pain and ear discharge.   Eyes: Positive for discharge, redness and itching. Negative for double vision, photophobia and pain.  Respiratory: Negative for cough, wheezing and stridor.   Cardiovascular: Negative.  Negative for orthopnea.  Gastrointestinal: Negative.  Negative for nausea, vomiting, abdominal pain, diarrhea and constipation.  Musculoskeletal: Negative.   Skin: Negative for rash.  Neurological: Negative for headaches.       Objective:   Physical Exam  Constitutional: She appears well-developed and well-nourished.  HENT:  Head: Normocephalic and atraumatic.  Right Ear: External ear normal.  Left Ear: External ear normal.  Nose: Nose normal.  Mouth/Throat: Oropharynx is clear and moist.  Eyes: Pupils are equal, round, and reactive to light. Right eye exhibits discharge and  exudate. Right eye exhibits no hordeolum. No foreign body present in the right eye. Left eye exhibits discharge and exudate. Left eye exhibits no hordeolum. No foreign body present in the left eye. Right conjunctiva is injected. Right conjunctiva has no hemorrhage. Left conjunctiva is injected. Left conjunctiva has no hemorrhage. No scleral icterus. Right eye exhibits abnormal extraocular motion. Left eye exhibits abnormal extraocular motion.  Neck: Normal range of motion. Neck supple.  Cardiovascular: Normal rate and regular rhythm.   Pulmonary/Chest: Effort normal.  Musculoskeletal: Normal range of motion.  Lymphadenopathy:    She has no cervical adenopathy.  Skin: Skin is warm and dry.          Assessment & Plan:

## 2012-03-10 ENCOUNTER — Ambulatory Visit (INDEPENDENT_AMBULATORY_CARE_PROVIDER_SITE_OTHER): Payer: BC Managed Care – PPO | Admitting: Family Medicine

## 2012-03-10 VITALS — BP 124/79 | HR 87 | Temp 98.3°F | Resp 16 | Ht 66.0 in | Wt 157.0 lb

## 2012-03-10 DIAGNOSIS — H1030 Unspecified acute conjunctivitis, unspecified eye: Secondary | ICD-10-CM

## 2012-03-10 DIAGNOSIS — H109 Unspecified conjunctivitis: Secondary | ICD-10-CM

## 2012-03-10 MED ORDER — KETOROLAC TROMETHAMINE 0.5 % OP SOLN: 1.0000 [drp] | Freq: Four times a day (QID) | OPHTHALMIC | Status: AC

## 2012-03-10 MED ORDER — PREDNISOLONE ACETATE 1 % OP SUSP: OPHTHALMIC | Status: DC

## 2012-03-10 NOTE — Progress Notes (Signed)
51 yo woman with chronic psoriasis. She was seen a week ago and put on ciprofloxacin drops initially she noted some mild improvement but gradually the redness came back along with some discharge. Smeltzer eyes are dry. She's been using refresh drops as well.  Objective: Bilateral mild injection of both conjunctiva and lids. EOM intact  Assessment: I believe this is a inflammatory conjunctivitis related to her psoriasis.  Plan: Pred Forte twice a day x5 days followed by Acular 4 times a day. Patient instructed to call me if she's not significantly improved in 48 hours

## 2012-04-09 DIAGNOSIS — B182 Chronic viral hepatitis C: Secondary | ICD-10-CM | POA: Insufficient documentation

## 2012-08-06 DIAGNOSIS — K7460 Unspecified cirrhosis of liver: Secondary | ICD-10-CM | POA: Insufficient documentation

## 2013-05-26 ENCOUNTER — Other Ambulatory Visit: Payer: Self-pay

## 2013-05-26 DIAGNOSIS — Z1231 Encounter for screening mammogram for malignant neoplasm of breast: Secondary | ICD-10-CM

## 2013-07-15 ENCOUNTER — Ambulatory Visit
Admission: RE | Admit: 2013-07-15 | Discharge: 2013-07-15 | Disposition: A | Discharge status: Home / Self Care | Payer: BC Managed Care – PPO | Source: Ambulatory Visit

## 2013-07-15 DIAGNOSIS — Z1231 Encounter for screening mammogram for malignant neoplasm of breast: Secondary | ICD-10-CM

## 2013-07-22 ENCOUNTER — Ambulatory Visit: Payer: Self-pay

## 2013-07-23 ENCOUNTER — Other Ambulatory Visit: Payer: Self-pay | Admitting: Family Medicine

## 2013-07-23 DIAGNOSIS — R928 Other abnormal and inconclusive findings on diagnostic imaging of breast: Secondary | ICD-10-CM

## 2013-08-12 ENCOUNTER — Other Ambulatory Visit: Payer: Self-pay | Admitting: Family Medicine

## 2013-08-12 ENCOUNTER — Ambulatory Visit
Admission: RE | Admit: 2013-08-12 | Discharge: 2013-08-12 | Disposition: A | Discharge status: Home / Self Care | Payer: BC Managed Care – PPO | Source: Ambulatory Visit | Attending: Family Medicine | Admitting: Family Medicine

## 2013-08-12 DIAGNOSIS — R921 Mammographic calcification found on diagnostic imaging of breast: Secondary | ICD-10-CM

## 2013-08-12 DIAGNOSIS — R928 Other abnormal and inconclusive findings on diagnostic imaging of breast: Secondary | ICD-10-CM

## 2013-08-18 ENCOUNTER — Ambulatory Visit
Admission: RE | Admit: 2013-08-18 | Discharge: 2013-08-18 | Disposition: A | Discharge status: Home / Self Care | Payer: BC Managed Care – PPO | Source: Ambulatory Visit | Attending: Family Medicine | Admitting: Family Medicine

## 2013-08-18 DIAGNOSIS — R921 Mammographic calcification found on diagnostic imaging of breast: Secondary | ICD-10-CM

## 2013-08-19 ENCOUNTER — Other Ambulatory Visit: Payer: Self-pay | Admitting: Family Medicine

## 2013-08-19 DIAGNOSIS — D0512 Intraductal carcinoma in situ of left breast: Secondary | ICD-10-CM

## 2013-08-21 ENCOUNTER — Telehealth: Payer: Self-pay | Admitting: *Deleted

## 2013-08-21 DIAGNOSIS — C50412 Malignant neoplasm of upper-outer quadrant of left female breast: Secondary | ICD-10-CM | POA: Insufficient documentation

## 2013-08-21 NOTE — Telephone Encounter (Signed)
Confirmed [facility] for 08/27/13 at 0800.  Instructions and contact information given.

## 2013-08-22 ENCOUNTER — Other Ambulatory Visit: Payer: BC Managed Care – PPO

## 2013-08-26 ENCOUNTER — Other Ambulatory Visit: Payer: Self-pay | Admitting: Family Medicine

## 2013-08-26 ENCOUNTER — Inpatient Hospital Stay
Admission: RE | Admit: 2013-08-26 | Discharge: 2013-08-26 | Disposition: A | Discharge status: Home / Self Care | Payer: BC Managed Care – PPO | Source: Ambulatory Visit | Attending: Family Medicine | Admitting: Family Medicine

## 2013-08-26 ENCOUNTER — Ambulatory Visit
Admission: RE | Admit: 2013-08-26 | Discharge: 2013-08-26 | Disposition: A | Discharge status: Home / Self Care | Payer: BC Managed Care – PPO | Source: Ambulatory Visit | Attending: Family Medicine | Admitting: Family Medicine

## 2013-08-26 DIAGNOSIS — R928 Other abnormal and inconclusive findings on diagnostic imaging of breast: Secondary | ICD-10-CM

## 2013-08-26 DIAGNOSIS — D0512 Intraductal carcinoma in situ of left breast: Secondary | ICD-10-CM

## 2013-08-26 MED ORDER — GADOBENATE DIMEGLUMINE 529 MG/ML IV SOLN
17.0000 mL | Freq: Once | INTRAVENOUS | Status: AC | PRN
  Administered 2013-08-26: 17 mL via INTRAVENOUS

## 2013-08-27 ENCOUNTER — Encounter: Payer: Self-pay | Admitting: Oncology

## 2013-08-27 ENCOUNTER — Encounter: Payer: Self-pay | Admitting: *Deleted

## 2013-08-27 ENCOUNTER — Encounter (INDEPENDENT_AMBULATORY_CARE_PROVIDER_SITE_OTHER): Payer: Self-pay | Admitting: Surgery

## 2013-08-27 ENCOUNTER — Other Ambulatory Visit (HOSPITAL_BASED_OUTPATIENT_CLINIC_OR_DEPARTMENT_OTHER): Payer: BC Managed Care – PPO

## 2013-08-27 ENCOUNTER — Telehealth: Payer: Self-pay | Admitting: Oncology

## 2013-08-27 ENCOUNTER — Ambulatory Visit (HOSPITAL_BASED_OUTPATIENT_CLINIC_OR_DEPARTMENT_OTHER): Payer: BC Managed Care – PPO | Admitting: Surgery

## 2013-08-27 ENCOUNTER — Ambulatory Visit (HOSPITAL_BASED_OUTPATIENT_CLINIC_OR_DEPARTMENT_OTHER): Payer: BC Managed Care – PPO | Admitting: Oncology

## 2013-08-27 ENCOUNTER — Ambulatory Visit: Payer: BC Managed Care – PPO | Admitting: Physical Therapy

## 2013-08-27 ENCOUNTER — Ambulatory Visit: Payer: BC Managed Care – PPO

## 2013-08-27 ENCOUNTER — Other Ambulatory Visit: Payer: Self-pay | Admitting: *Deleted

## 2013-08-27 ENCOUNTER — Ambulatory Visit
Admission: RE | Admit: 2013-08-27 | Discharge: 2013-08-27 | Disposition: A | Discharge status: Home / Self Care | Payer: BC Managed Care – PPO | Source: Ambulatory Visit | Attending: Radiation Oncology | Admitting: Radiation Oncology

## 2013-08-27 VITALS — BP 138/87 | HR 64 | Temp 98.3°F | Resp 18 | Ht 66.0 in | Wt 187.2 lb

## 2013-08-27 DIAGNOSIS — Z170 Estrogen receptor positive status [ER+]: Secondary | ICD-10-CM

## 2013-08-27 DIAGNOSIS — C50412 Malignant neoplasm of upper-outer quadrant of left female breast: Secondary | ICD-10-CM

## 2013-08-27 DIAGNOSIS — D0590 Unspecified type of carcinoma in situ of unspecified breast: Secondary | ICD-10-CM

## 2013-08-27 DIAGNOSIS — C50419 Malignant neoplasm of upper-outer quadrant of unspecified female breast: Secondary | ICD-10-CM

## 2013-08-27 DIAGNOSIS — D0512 Intraductal carcinoma in situ of left breast: Secondary | ICD-10-CM

## 2013-08-27 LAB — CBC WITH DIFFERENTIAL/PLATELET
BASO%: 0.6 % (ref 0.0–2.0)
EOS%: 2.7 % (ref 0.0–7.0)
HCT: 43.2 % (ref 34.8–46.6)
LYMPH%: 38.7 % (ref 14.0–49.7)
MCH: 32.3 pg (ref 25.1–34.0)
MCHC: 34 g/dL (ref 31.5–36.0)
MCV: 94.9 fL (ref 79.5–101.0)
MONO%: 10.5 % (ref 0.0–14.0)
NEUT%: 47.5 % (ref 38.4–76.8)
Platelets: 139 10*3/uL — ABNORMAL LOW (ref 145–400)

## 2013-08-27 LAB — COMPREHENSIVE METABOLIC PANEL (CC13)
Anion Gap: 12 mEq/L — ABNORMAL HIGH (ref 3–11)
BUN: 17.4 mg/dL (ref 7.0–26.0)
CO2: 24 mEq/L (ref 22–29)
Creatinine: 0.8 mg/dL (ref 0.6–1.1)
Glucose: 92 mg/dl (ref 70–140)
Sodium: 143 mEq/L (ref 136–145)
Total Bilirubin: 0.68 mg/dL (ref 0.20–1.20)
Total Protein: 7.1 g/dL (ref 6.4–8.3)

## 2013-08-27 MED ORDER — LORAZEPAM 1 MG PO TABS: ORAL_TABLET | ORAL | Status: DC

## 2013-08-27 NOTE — Progress Notes (Signed)
Patient ID: Connie [name], female   DOB: 08/04/1961, 52 y.o.   MRN: [id number]  No chief complaint on file.   HPI Connie [name] is a 52 y.o. female.  PT PRESENTS AT REQUEST OF DR GREEN FOR DCIS LEFT BREAST .   FOUND ON MAMMOGRAM.  2 ND AREA 1.2 CM PENDING BIOPSY. NO COMPLAINTS.  NO MASS.   HPI  Past Medical History  Diagnosis Date  . Hepatitis C     Past Surgical History  Procedure Laterality Date  . Caesaran       x2    No family history on file.  Social History History  Substance Use Topics  . Smoking status: Former Smoker -- 0.50 packs/day    Types: Cigarettes    Quit date: 08/20/2013  . Smokeless tobacco: Not on file  . Alcohol Use: No    No Known Allergies  Current Outpatient Prescriptions  Medication Sig Dispense Refill  . BIOTIN PO Take 1 tablet by mouth daily.      . buPROPion (WELLBUTRIN XL) 300 MG 24 hr tablet Take 300 mg by mouth daily.      . calcium carbonate 200 MG capsule Take 250 mg by mouth 2 (two) times daily with a meal.      . prednisoLONE acetate (PRED FORTE) 1 % ophthalmic suspension Bid for 5 days then switch  5 mL  0  . Prenatal Vit-Fe Fumarate-FA (PRENATAL MULTIVITAMIN) TABS tablet Take 1 tablet by mouth daily.      . propranolol (INDERAL) 60 MG tablet Take 60 mg by mouth daily.       No current facility-administered medications for this visit.    Review of Systems Review of Systems  Constitutional: Negative for fever, chills and unexpected weight change.  HENT: Negative for congestion, hearing loss, sore throat, trouble swallowing and voice change.   Eyes: Negative for visual disturbance.  Respiratory: Negative for cough and wheezing.   Cardiovascular: Negative for chest pain, palpitations and leg swelling.  Gastrointestinal: Negative for nausea, vomiting, abdominal pain, diarrhea, constipation, blood in stool, abdominal distention and anal bleeding.  Genitourinary: Negative for hematuria, vaginal bleeding and difficulty urinating.   Musculoskeletal: Negative for arthralgias.  Skin: Negative for rash and wound.  Neurological: Negative for seizures, syncope and headaches.  Hematological: Negative for adenopathy. Does not bruise/bleed easily.  Psychiatric/Behavioral: Negative for confusion.    There were no vitals taken for this visit.  Physical Exam Physical Exam  Constitutional: She is oriented to person, place, and time. She appears well-developed and well-nourished.  HENT:  Head: Normocephalic and atraumatic.  Eyes: EOM are normal. Pupils are equal, round, and reactive to light.  Neck: Normal range of motion. Neck supple.  Cardiovascular: Normal rate and regular rhythm.   Pulmonary/Chest: Effort normal and breath sounds normal. Right breast exhibits no inverted nipple, no mass, no nipple discharge, no skin change and no tenderness. Left breast exhibits no inverted nipple, no mass, no nipple discharge, no skin change and no tenderness. Breasts are symmetrical.  Musculoskeletal: Normal range of motion.  Neurological: She is alert and oriented to person, place, and time.  Skin: Skin is warm and dry.  Psychiatric: She has a normal mood and affect. Her behavior is normal. Judgment and thought content normal.    Data Reviewed CLINICAL DATA: Recently diagnosed left breast 12 o'clock location  DCIS manifesting as calcifications detected at screening  mammography.  EXAM:  BILATERAL BREAST MRI WITH AND WITHOUT CONTRAST  LABS: BUN and creatinine   were obtained on site at [facility] at  [address]  Results: BUN 18 mg/dL, Creatinine 0.8 mg/dL.  TECHNIQUE:  Multiplanar, multisequence MR images of both breasts were obtained  prior to and following the intravenous administration of 17ml of  MultiHance.  THREE-DIMENSIONAL MR IMAGE RENDERING ON INDEPENDENT WORKSTATION:  Three-dimensional MR images were rendered by post-processing of the  original MR data on an independent workstation. The   three-dimensional MR images were interpreted, and findings are  reported in the following complete MRI report for this study. Three  dimensional images were evaluated at the independent DynaCad  workstation  COMPARISON: Previous exams  FINDINGS:  Breast composition: b. Scattered fibroglandular tissue  Background parenchymal enhancement: Mild  Right breast: No mass or abnormal enhancement.  Left breast: Post biopsy changes noted in the left breast 12 o'clock  location at the site of recent biopsy proven DCIS. There is an area  of clumped nodular enhancement with wash-in/ washout type  enhancement kinetics with central clip artifact in this region,  measuring 1.7 x 1.4 x 1.0 cm. This corresponds to the area of  calcifications seen on the prior exams.  1.2 cm inferior and medial to the area of biopsy proven DCIS, in the  central breast 9 o'clock location, is an irregular area of  enhancement measuring 1.0 x 0.7 x 0.6 cm, also demonstrating  wash-in/ washout type enhancement kinetics.  Lymph nodes: No abnormal appearing lymph nodes.  Ancillary findings: Mild inhomogeneous marrow signal is noted within  the sternum without focal lesion identified.  IMPRESSION:  1.7 cm area of abnormal clumped nodular enhancement at the site of  biopsy proven left breast 12 o'clock location DCIS.  2 additional area of abnormal enhancement measuring 1 cm maximally  in the left central breast/ 9 o'clock location is identified. This  area is felt to most likely be sonographically occult, therefore MRI  guided core biopsy is recommended and the patient will be contacted  to schedule this PROCEDURE.  No MRI evidence for malignancy in the right breast.  RECOMMENDATION:  Left MRI guided core biopsy  BI-RADS CATEGORY 6: Known biopsy-proven malignancy - appropriate  action should be taken.  Electronically Signed  By: Gretchen Green M.D.  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  AWAIT SECOND BIOPSY TO DETERMINE LUMPECTOMY SIZE. The procedure has been discussed with the patient. Alternatives to surgery have been discussed with the patient.  Risks of surgery include bleeding,  Infection,  Seroma formation, death,  and the need for further surgery.   The patient understands and wishes to proceed.       Devanshi Califf A. 08/27/2013, 11:08 AM    

## 2013-08-27 NOTE — Progress Notes (Signed)
Radiation Oncology         [phone]) [phone] ________________________________  Initial outpatient Consultation - Date: 08/27/2013   Name: Connie [name] MRN: [id number]   DOB: 1961-09-09  REFERRING PHYSICIAN: Dortha Schwalbe., MD  DIAGNOSIS: DCIS of the left breast  HISTORY OF PRESENT ILLNESS::Connie [name] is a 52 y.o. female  who in for screening mammogram. She's been to have calcifications in the left breast measuring about 9 mm. 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 MRI showed a slightly larger area measuring 1.7 x 1.4 x 1.0 cm. A second area was noted in the inferior and medial to this and a biopsy was recommended. She has a scheduled for the 16th. She is accompanied by her husband today. She has no symptoms prior to this. No prior history of the ligaments he. She was treated for hepatitis C earlier this year. She had menses at 13. She is still having periods. She never used hormone replacement. She is GX P2 with her first live birth at 71.Marland Kitchen  PREVIOUS RADIATION THERAPY: No  PAST MEDICAL HISTORY:  has a past medical history of Hepatitis C.    PAST SURGICAL HISTORY: Past Surgical History  Procedure Laterality Date  . Caesaran       x2    FAMILY HISTORY: No family history on file.  SOCIAL HISTORY:  History  Substance Use Topics  . Smoking status: Former Smoker -- 0.50 packs/day    Types: Cigarettes    Quit date: 08/20/2013  . Smokeless tobacco: Not on file  . Alcohol Use: No    ALLERGIES: Review of patient's allergies indicates no known allergies.  MEDICATIONS:  Current Outpatient Prescriptions  Medication Sig Dispense Refill  . BIOTIN PO Take 1 tablet by mouth daily.      Marland Kitchen buPROPion (WELLBUTRIN XL) 300 MG 24 hr tablet Take 300 mg by mouth daily.      . calcium carbonate 200 MG capsule Take 250 mg by mouth 2 (two) times daily with a meal.      . prednisoLONE acetate (PRED FORTE) 1 % ophthalmic suspension Bid for 5 days then  switch  5 mL  0  . Prenatal Vit-Fe Fumarate-FA (PRENATAL MULTIVITAMIN) TABS tablet Take 1 tablet by mouth daily.      . propranolol (INDERAL) 60 MG tablet Take 60 mg by mouth daily.       No current facility-administered medications for this encounter.    REVIEW OF SYSTEMS:  A 15 point review of systems is documented in the electronic medical record. This was obtained by the nursing staff. However, I reviewed this with the patient to discuss relevant findings and make appropriate changes.  Pertinent items are noted in HPI.  PHYSICAL EXAM: There were no vitals filed for this visit.. . She is a pleasant female in no distress sitting comfortably examining table. She has no palpable cervical or subclavicular lymph nodes. No palpable abnormalities of the right breast. She has some bruising of biopsy change in the upper outer quadrant of the left breast. No palpable abnormalities otherwise. She has 5 out of 5 strength bilaterally. She is alert and oriented x3.  LABORATORY DATA:  Lab Results  Component Value Date   WBC 6.3 08/27/2013   HGB 14.7 08/27/2013   HCT 43.2 08/27/2013   MCV 94.9 08/27/2013   PLT 139* 08/27/2013   Lab Results  Component Value Date   NA 143 08/27/2013   K 3.9 08/27/2013  CL 106 11/22/2011   CO2 24 08/27/2013   Lab Results  Component Value Date   ALT 42 08/27/2013   AST 35* 08/27/2013   ALKPHOS 89 08/27/2013   BILITOT 0.68 08/27/2013     RADIOGRAPHY: Mr Breast Bilateral W Wo Contrast  08/26/2013   CLINICAL DATA:  Recently diagnosed left breast 12 o'clock location DCIS manifesting as calcifications detected at screening mammography.  EXAM: BILATERAL BREAST MRI WITH AND WITHOUT CONTRAST  LABS:  BUN and creatinine were obtained on site at [facility] at  [address]  Results:  BUN 18 mg/dL,  Creatinine 0.8 mg/dL.  TECHNIQUE: Multiplanar, multisequence MR images of both breasts were obtained prior to and following the intravenous administration of 17ml  of MultiHance.  THREE-DIMENSIONAL MR IMAGE RENDERING ON INDEPENDENT WORKSTATION:  Three-dimensional MR images were rendered by post-processing of the original MR data on an independent workstation. The three-dimensional MR images were interpreted, and findings are reported in the following complete MRI report for this study. Three dimensional images were evaluated at the independent DynaCad workstation  COMPARISON:  Previous exams  FINDINGS: Breast composition: b. Scattered fibroglandular tissue  Background parenchymal enhancement: Mild  Right breast: No mass or abnormal enhancement.  Left breast: Post biopsy changes noted in the left breast 12 o'clock location at the site of recent biopsy proven DCIS. There is an area of clumped nodular enhancement with wash-in/ washout type enhancement kinetics with central clip artifact in this region, measuring 1.7 x 1.4 x 1.0 cm. This corresponds to the area of calcifications seen on the prior exams.  1.2 cm inferior and medial to the area of biopsy proven DCIS, in the central breast 9 o'clock location, is an irregular area of enhancement measuring 1.0 x 0.7 x 0.6 cm, also demonstrating wash-in/ washout type enhancement kinetics.  Lymph nodes: No abnormal appearing lymph nodes.  Ancillary findings: Mild inhomogeneous marrow signal is noted within the sternum without focal lesion identified.  IMPRESSION: 1.7 cm area of abnormal clumped nodular enhancement at the site of biopsy proven left breast 12 o'clock location DCIS.  2 additional area of abnormal enhancement measuring 1 cm maximally in the left central breast/ 9 o'clock location is identified. This area is felt to most likely be sonographically occult, therefore MRI guided core biopsy is recommended and the patient will be contacted to schedule this PROCEDURE.  No MRI evidence for malignancy in the right breast.  RECOMMENDATION: Left MRI guided core biopsy  BI-RADS CATEGORY  6: Known biopsy-proven malignancy - appropriate  action should be taken.   Electronically Signed   By: Christiana Pellant M.D.   On: 08/26/2013 13:32   Mm Digital Diag Ltd L  08/12/2013   CLINICAL DATA:  Screening recall for left breast calcifications.  EXAM: DIGITAL DIAGNOSTIC LIMITED LEFT MAMMOGRAM  COMPARISON:  Previous exams.  ACR Breast Density Category c: The breasts are heterogeneously dense, which may obscure small masses.  FINDINGS: There is a group of calcifications in the central to slightly outer and slightly upper left breast of varying shapes and sizes in spanning a distance of approximately 9 mm, confirmed on the additional CC and mL spot compression magnification views.  Mammographic images were processed with CAD.  IMPRESSION: Suspicious left breast calcifications.  RECOMMENDATION: Stereotactic guided biopsy of the suspicious calcifications in the left breast is recommended. This is scheduled for Monday December 1st at 2 p.m.  I have discussed the findings and recommendations with the patient. Results were also provided in writing  at the conclusion of the visit. If applicable, a reminder letter will be sent to the patient regarding the next appointment.  BI-RADS CATEGORY  4: Suspicious abnormality - biopsy should be considered.   Electronically Signed   By: Edwin Cap M.D.   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 This corresponds well with the imaging findings. The patient was contacted by telephone and given the results of the biopsy. She states the wound site is clean and dry with no signs of hematoma or infection. The patient is scheduled to be seen at the [facility] on 08/27/2013. Breast MRI will be arranged.   Electronically Signed   By: Baird Lyons M.D.   On: 08/19/2013 16:36   08/19/2013   CLINICAL DATA:  Left breast calcifications.  EXAM:  Left STEREOTACTIC CORE NEEDLE BIOPSY  COMPARISON:  Previous exams.  FINDINGS: The patient and I discussed the procedure of stereotactic-guided biopsy including benefits and alternatives. We discussed the high likelihood of a successful procedure. We discussed the risks of the procedure including infection, bleeding, tissue injury, clip migration, and inadequate sampling. Informed written consent was given. The usual time out protocol was performed immediately prior to the procedure.  Using sterile technique and 2% Lidocaine as local anesthetic, under stereotactic guidance, a 8 gauge vacuum assisted device was used to perform core needle biopsy of calcifications in the upper aspect of the left breast using a superior approach. Specimen radiograph was performed showing calcifications in the tissue sample. Specimens with calcifications are identified for pathology.  At the conclusion of the procedure, a bowtie ribbon shaped tissue marker clip was deployed into the biopsy cavity. Follow-up 2-view mammogram showed the clip migrated approximately 2.5 cm superior to the biopsied calcifications.  IMPRESSION: Stereotactic-guided biopsy of the left breast. No apparent complications.  Electronically Signed: By: Baird Lyons M.D. On: 08/18/2013 15:31      IMPRESSION: DCIS of left breast   PLAN: I spoke with Ms. Barkett about the role of radiation and decreasing local failures in patients who undergo lumpectomy for DCIS. We discussed the process of simulation the placement tattoos. We discussed the use of breath hold technique for cardiac sparing. We discussed 4-6 weeks of treatment as an outpatient. We discussed possible side effects of treatment including but not limited to skin redness and fatigue. She will meet with surgery, medical oncology, physical therapy, and our breast cancer navigator. I will plan on seeing her back after surgery.  I spent 40 minutes  face to face with the patient and more than 50% of that time  was spent in counseling and/or coordination of care.   ------------------------------------------------  Lurline Hare, MD

## 2013-08-27 NOTE — Progress Notes (Signed)
Patient states she needs something for anxiety for MRI. Verbal orders rec'd and called into pharmacy.

## 2013-08-27 NOTE — Patient Instructions (Signed)
Lumpectomy A lumpectomy is a form of "breast conserving" or "breast preservation" surgery. It may also be referred to as a partial mastectomy. During a lumpectomy, the portion of the breast that contains the cancerous tumor or breast mass (the lump) is removed. Some normal tissue around the lump may also be removed to make sure all the tumor has been removed. This surgery should take 40 minutes or less. LET YOUR HEALTH CARE PROVIDER KNOW ABOUT:  Any allergies you have.  All medicines you are taking, including vitamins, herbs, eye drops, creams, and over-the-counter medicines.  Previous problems you or members of your family have had with the use of anesthetics.  Any blood disorders you have.  Previous surgeries you have had.  Medical conditions you have. RISKS AND COMPLICATIONS Generally, this is a safe procedure. However, as with any procedure, complications can occur. Possible complications include:  Bleeding.  Infection.  Pain.  Temporary swelling.  Change in the shape of the breast, particularly if a large portion is removed. BEFORE THE PROCEDURE  Ask your health care provider about changing or stopping your regular medicines.  Do not eat or drink anything for 7 8 hours before the surgery or as directed by your health care provider. Ask your health care provider if you can take a sip of water with any approved medicines.  On the day of surgery, your healthcare provider will use a mammogram or ultrasound to locate and mark the tumor in your breast. These markings on your breast will show where the cut (incision) will be made. PROCEDURE   An IV tube will be put into one of your veins.  You may be given medicine to help you relax before the surgery (sedative). You will be given one of the following:  A medicine that numbs the area (local anesthesia).  A medicine that makes you go to sleep (general anesthesia).  Your health care provider will use a kind of electric scalpel  that uses heat to minimize bleeding (electrocautery knife).  A curved incision (like a smile or frown) that follows the natural curve of your breast is made, to allow for minimal scarring and better healing.  The tumor will be removed with some of the surrounding tissue. This will be sent to the lab for analysis. Your health care provider may also remove your lymph nodes at this time if needed.  Sometimes, but not always, a rubber tube called a drain will be surgically inserted into your breast area or armpit to collect excess fluid that may accumulate in the space where the tumor was. This drain is connected to a plastic bulb on the outside of your body. This drain creates suction to help remove the fluid.  The incisions will be closed with stitches (sutures).  A bandage may be placed over the incisions. AFTER THE PROCEDURE  You will be taken to the recovery area.  You will be given medicine for pain.  A small rubber drain may be placed in the breast for 2 3 days to prevent a collection of blood (hematoma) from developing in the breast. You will be given instructions on caring for the drain before you go home.  A pressure bandage (dressing) will be applied for 1 2 days to prevent bleeding. Ask your health care provider how to care for your bandage at home. Document Released: 10/16/2006 Document Revised: 05/07/2013 Document Reviewed: 02/07/2013 [employer] Patient Information 2014 [employer].  

## 2013-08-27 NOTE — Progress Notes (Signed)
Checked in new pt with no financial concerns. °

## 2013-08-27 NOTE — Progress Notes (Signed)
ID: Connie [name] OB: [birth year]  MR#: [id number]  OZH#:[id number]  PCP: No primary provider on file. GYN:   SU: Connie [name] OTHER MD: Connie [name]  Connie [name], Connie [name]  CHIEF COMPLAINT: "I need information"  HISTORY OF PRESENT ILLNESS: The patient had routine screening bilateral mammography at the breast center 07/15/2013 showing calcifications in the left breast which call for further evaluation. On 08/12/2013 limited diagnostic mammography of the left breast confirmed a group of calcifications in the upper outer quadrant spanning a distance of approximately 9 mm.  Biopsy of this area on 08/18/2013 showed ([id number]) ductal carcinoma in situ, intermediate grade, estrogen and progesterone receptor positive by verbal report. A second biopsy that proved to be a fibroadenoma.  Breast MRI 08/26/2013 showed an area of clumped nodular enhancement associated with clip artifact in the left breast measuring 1.7 cm. A second area inferior and medial to the first also showed irregular enhancement. There were no abnormal appearing lymph nodes. There was mild inhomogeneous marrow signal in the sternum without focal lesion found. Biopsy of the second area of concern in the left breast is planned.  The patient's subsequent history is as detailed below  INTERVAL HISTORY: Connie [name] was evaluated in the multidisciplinary breast cancer clinic 08/27/2013 accompanied by her husband Connie [name]  REVIEW OF SYSTEMS: There were no specific symptoms leading to the original mammogram, which was routinely scheduled. The patient denies unusual headaches, visual changes, nausea, vomiting, stiff neck, dizziness, or gait imbalance. There has been no cough, phlegm production, or pleurisy, no chest pain or pressure, and no change in bowel or bladder habits. Has been no fever, rash, bleeding, unexplained fatigue or unexplained weight loss. A detailed review of systems was otherwise entirely negative.  PAST  MEDICAL HISTORY: Past Medical History  Diagnosis Date  . Hepatitis C    psoriasis  PAST SURGICAL HISTORY: Past Surgical History  Procedure Laterality Date  . Caesaran       x2    FAMILY HISTORY No family history on file. The patient's father died from a myocardial infarction at the age of 62. The patient's mother died with congestive heart failure secondary to myocarditis at the age of 84. The patient had one brother, and one sister. There is no history of breast or ovarian cancer in the family to her knowledge.  GYNECOLOGIC HISTORY:  Menarche age 89, first live birth age 43, the patient is GX P2. Last menstrual period was approximately 2008. She did not take hormone replacement.  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 Son Connie [name] December: Were works as a Research scientist (medical). Son Connie [name] 13 lives with the patient and her husband. There are no grandchildren. The patient attends [employer].    ADVANCED DIRECTIVES: Not in place   HEALTH MAINTENANCE: History  Substance Use Topics  . Smoking status: Former Smoker -- 0.50 packs/day    Types: Cigarettes    Quit date: 08/20/2013  . Smokeless tobacco: Not on file  . Alcohol Use: No     Colonoscopy:  PAP:  Bone density:  Lipid panel:  No Known Allergies  Current Outpatient Prescriptions  Medication Sig Dispense Refill  . BIOTIN PO Take 1 tablet by mouth daily.      Marland Kitchen buPROPion (WELLBUTRIN XL) 300 MG 24 hr tablet Take 300 mg by mouth daily.      . Prenatal  Vit-Fe Fumarate-FA (PRENATAL MULTIVITAMIN) TABS tablet Take 1 tablet by mouth daily.      . propranolol (INDERAL) 60 MG tablet Take 60 mg by mouth daily.      Marland Kitchen LORazepam (ATIVAN) 1 MG tablet Patient may take 1 tablet 1 hour prior to MRI, may repeat x 1 if driver available.  2 tablet  0   No current facility-administered medications for this visit.     OBJECTIVE: Middle-aged white woman in no acute distress  Filed Vitals:   08/27/13 0851  BP: 138/87  Pulse: 64  Temp: 98.3 F (36.8 C)  Resp: 18     Body mass index is 30.23 kg/(m^2).    ECOG FS:0 - Asymptomatic  Ocular: Sclerae unicteric, pupils equal and round  Ear-nose-throat: Oropharynx clear and moist  Lymphatic: No cervical or supraclavicular adenopathy Lungs no rales or rhonchi, good excursion bilaterally Heart regular rate and rhythm, no murmur appreciated Abd soft, nontender, positive bowel sounds MSK no focal spinal tenderness, no joint edema Neuro: non-focal, well-oriented, appropriate affect Breasts: The right breast is unremarkable. The left breast is status post recent biopsy. There is a small ecchymosis. I do not palpate a well-defined mass. There are no skin or nipple changes of concern. The left breast is unremarkable.   LAB RESULTS:  CMP     Component Value Date/Time   NA 143 08/27/2013 0814   NA 141 11/22/2011 1106   K 3.9 08/27/2013 0814   K 3.8 11/22/2011 1106   CL 106 11/22/2011 1106   CO2 24 08/27/2013 0814   CO2 26 11/22/2011 1106   GLUCOSE 92 08/27/2013 0814   GLUCOSE 85 11/22/2011 1106   BUN 17.4 08/27/2013 0814   BUN 10 11/22/2011 1106   CREATININE 0.8 08/27/2013 0814   CREATININE 0.61 11/22/2011 1106   CALCIUM 9.7 08/27/2013 0814   CALCIUM 9.3 11/22/2011 1106   PROT 7.1 08/27/2013 0814   PROT 7.2 11/22/2011 1106   ALBUMIN 3.6 08/27/2013 0814   ALBUMIN 3.9 11/22/2011 1106   AST 35* 08/27/2013 0814   AST 346* 11/22/2011 1106   ALT 42 08/27/2013 0814   ALT 422* 11/22/2011 1106   ALKPHOS 89 08/27/2013 0814   ALKPHOS 154* 11/22/2011 1106   BILITOT 0.68 08/27/2013 0814   BILITOT 1.5* 11/22/2011 1106    I No results found for this basename: SPEP, UPEP,  kappa and lambda light chains    Lab Results  Component Value Date   WBC 6.3 08/27/2013   NEUTROABS 3.0 08/27/2013   HGB 14.7 08/27/2013   HCT 43.2 08/27/2013   MCV 94.9 08/27/2013   PLT 139* 08/27/2013       Chemistry      Component Value Date/Time   NA 143 08/27/2013 0814   NA 141 11/22/2011 1106   K 3.9 08/27/2013 0814   K 3.8 11/22/2011 1106   CL 106 11/22/2011 1106   CO2 24 08/27/2013 0814   CO2 26 11/22/2011 1106   BUN 17.4 08/27/2013 0814   BUN 10 11/22/2011 1106   CREATININE 0.8 08/27/2013 0814   CREATININE 0.61 11/22/2011 1106      Component Value Date/Time   CALCIUM 9.7 08/27/2013 0814   CALCIUM 9.3 11/22/2011 1106   ALKPHOS 89 08/27/2013 0814   ALKPHOS 154* 11/22/2011 1106   AST 35* 08/27/2013 0814   AST 346* 11/22/2011 1106   ALT 42 08/27/2013 0814   ALT 422* 11/22/2011 1106   BILITOT 0.68 08/27/2013 0814   BILITOT 1.5* 11/22/2011 1106  No results found for this basename: LABCA2    No components found with this basename: [id number]    No results found for this basename: INR,  in the last 168 hours  Urinalysis    Component Value Date/Time   BILIRUBINUR small 11/22/2011 1106   UROBILINOGEN 2.0 11/22/2011 1106   NITRITE neg 11/22/2011 1106   LEUKOCYTESUR Negative 11/22/2011 1106    STUDIES: Mr Breast Bilateral W Wo Contrast  08/26/2013   CLINICAL DATA:  Recently diagnosed left breast 12 o'clock location DCIS manifesting as calcifications detected at screening mammography.  EXAM: BILATERAL BREAST MRI WITH AND WITHOUT CONTRAST  LABS:  BUN and creatinine were obtained on site at [facility] at  [address]  Results:  BUN 18 mg/dL,  Creatinine 0.8 mg/dL.  TECHNIQUE: Multiplanar, multisequence MR images of both breasts were obtained prior to and following the intravenous administration of 17ml of MultiHance.  THREE-DIMENSIONAL MR IMAGE RENDERING ON INDEPENDENT WORKSTATION:  Three-dimensional MR images were rendered by post-processing of the original MR data on an independent workstation. The three-dimensional MR images were interpreted, and findings are reported in the following complete MRI report for this study. Three dimensional images were evaluated at the independent  DynaCad workstation  COMPARISON:  Previous exams  FINDINGS: Breast composition: b. Scattered fibroglandular tissue  Background parenchymal enhancement: Mild  Right breast: No mass or abnormal enhancement.  Left breast: Post biopsy changes noted in the left breast 12 o'clock location at the site of recent biopsy proven DCIS. There is an area of clumped nodular enhancement with wash-in/ washout type enhancement kinetics with central clip artifact in this region, measuring 1.7 x 1.4 x 1.0 cm. This corresponds to the area of calcifications seen on the prior exams.  1.2 cm inferior and medial to the area of biopsy proven DCIS, in the central breast 9 o'clock location, is an irregular area of enhancement measuring 1.0 x 0.7 x 0.6 cm, also demonstrating wash-in/ washout type enhancement kinetics.  Lymph nodes: No abnormal appearing lymph nodes.  Ancillary findings: Mild inhomogeneous marrow signal is noted within the sternum without focal lesion identified.  IMPRESSION: 1.7 cm area of abnormal clumped nodular enhancement at the site of biopsy proven left breast 12 o'clock location DCIS.  2 additional area of abnormal enhancement measuring 1 cm maximally in the left central breast/ 9 o'clock location is identified. This area is felt to most likely be sonographically occult, therefore MRI guided core biopsy is recommended and the patient will be contacted to schedule this PROCEDURE.  No MRI evidence for malignancy in the right breast.  RECOMMENDATION: Left MRI guided core biopsy  BI-RADS CATEGORY  6: Known biopsy-proven malignancy - appropriate action should be taken.   Electronically Signed   By: Christiana Pellant M.D.   On: 08/26/2013 13:32   Mm Digital Diag Ltd L  08/12/2013   CLINICAL DATA:  Screening recall for left breast calcifications.  EXAM: DIGITAL DIAGNOSTIC LIMITED LEFT MAMMOGRAM  COMPARISON:  Previous exams.  ACR Breast Density Category c: The breasts are heterogeneously dense, which may obscure small masses.   FINDINGS: There is a group of calcifications in the central to slightly outer and slightly upper left breast of varying shapes and sizes in spanning a distance of approximately 9 mm, confirmed on the additional CC and mL spot compression magnification views.  Mammographic images were processed with CAD.  IMPRESSION: Suspicious left breast calcifications.  RECOMMENDATION: Stereotactic guided biopsy of the suspicious calcifications in the left breast is  recommended. This is scheduled for Monday December 1st at 2 p.m.  I have discussed the findings and recommendations with the patient. Results were also provided in writing at the conclusion of the visit. If applicable, a reminder letter will be sent to the patient regarding the next appointment.  BI-RADS CATEGORY  4: Suspicious abnormality - biopsy should be considered.   Electronically Signed   By: Edwin Cap M.D.   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 This corresponds well with the imaging findings. The patient was contacted by telephone and given the results of the biopsy. She states the wound site is clean and dry with no signs of hematoma or infection. The patient is scheduled to be seen at the Multidisciplinary Clinic on 08/27/2013. Breast MRI will be arranged.   Electronically Signed   By: Baird Lyons M.D.   On: 08/19/2013 16:36   08/19/2013   CLINICAL DATA:  Left breast calcifications.  EXAM: Left STEREOTACTIC CORE NEEDLE BIOPSY  COMPARISON:  Previous exams.  FINDINGS: The patient and I discussed the procedure of stereotactic-guided biopsy including benefits and alternatives. We discussed the high likelihood of a successful procedure. We discussed the risks of the procedure including infection, bleeding, tissue injury, clip migration, and inadequate sampling. Informed  written consent was given. The usual time out protocol was performed immediately prior to the procedure.  Using sterile technique and 2% Lidocaine as local anesthetic, under stereotactic guidance, a 8 gauge vacuum assisted device was used to perform core needle biopsy of calcifications in the upper aspect of the left breast using a superior approach. Specimen radiograph was performed showing calcifications in the tissue sample. Specimens with calcifications are identified for pathology.  At the conclusion of the procedure, a bowtie ribbon shaped tissue marker clip was deployed into the biopsy cavity. Follow-up 2-view mammogram showed the clip migrated approximately 2.5 cm superior to the biopsied calcifications.  IMPRESSION: Stereotactic-guided biopsy of the left breast. No apparent complications.  Electronically Signed: By: Baird Lyons M.D. On: 08/18/2013 15:31    ASSESSMENT: 52 y.o. [employer] woman status post left breast biopsy 08/18/2013 for a ductal carcinoma in situ, grade 2, estrogen and progesterone receptor positive.  (1) a second area suspicious for carcinoma is scheduled for MRI guided biopsy 09/02/2013  PLAN: We spent the better part of today's hour-long appointment discussing the biology of breast cancer in general, and the specifics of the patient's tumor in particular. Chiniqua understands that in noninvasive breast cancer in itself is not life-threatening. He does need to be removed, and we are recommending breast conserving surgery. She will have some risk of local recurrence and adjuvant radiation will cut that risk by more than half.  Since the cancer cells in noninvasive cases cannot travel to vital organs, the benefit of antiestrogen therapy as far as his cancer is concerned is marginal. It well again the cut in half the risk of local recurrence. However anti-estrogens will also cut in half the risk of a new breast cancer developing in either breast, and that risk is approximately 1%  per year.  This is more of a motivator. Accordingly when and is done with her surgery and radiation, and assuming no invasive disease is found, she will return to see me and we will discuss the antiestrogen options.  Hildreth has  a good understanding of this plan, and agrees with this. She knows the goal of treatment is cure. She will call us with any problems that may develop before next visit here.   Lowella Dell, MD   08/27/2013 7:09 PM

## 2013-09-01 ENCOUNTER — Telehealth: Payer: Self-pay | Admitting: *Deleted

## 2013-09-01 ENCOUNTER — Encounter: Payer: Self-pay | Admitting: *Deleted

## 2013-09-01 NOTE — Progress Notes (Signed)
Could not fax care plan due to No PCP.

## 2013-09-01 NOTE — Progress Notes (Signed)
Faxed Care Plan to Leigh at [facility] & took copy to Med Rec to scan.

## 2013-09-01 NOTE — Telephone Encounter (Signed)
Called and spoke with patient from [facility] 08/27/13.  No questions or concerns at this time.  Contact information given.

## 2013-09-02 ENCOUNTER — Ambulatory Visit
Admission: RE | Admit: 2013-09-02 | Discharge: 2013-09-02 | Disposition: A | Discharge status: Home / Self Care | Payer: BC Managed Care – PPO | Source: Ambulatory Visit | Attending: Family Medicine | Admitting: Family Medicine

## 2013-09-02 DIAGNOSIS — R928 Other abnormal and inconclusive findings on diagnostic imaging of breast: Secondary | ICD-10-CM

## 2013-09-02 MED ORDER — GADOBENATE DIMEGLUMINE 529 MG/ML IV SOLN
17.0000 mL | Freq: Once | INTRAVENOUS | Status: AC | PRN
  Administered 2013-09-02: 17 mL via INTRAVENOUS

## 2013-09-03 ENCOUNTER — Encounter: Payer: Self-pay | Admitting: *Deleted

## 2013-09-03 ENCOUNTER — Telehealth (INDEPENDENT_AMBULATORY_CARE_PROVIDER_SITE_OTHER): Payer: Self-pay

## 2013-09-03 NOTE — Telephone Encounter (Signed)
error 

## 2013-09-03 NOTE — Progress Notes (Signed)
[facility] Psychosocial Distress Screening Clinical Social Work  Patient completed distress screening protocol, and scored a 10 on the Psychosocial Distress Thermometer which indicates severe distress. Clinical Social Worker met with pt in [facility] (08/27/13)  to assess for distress and other psychosocial needs.  Pt stated felt much better and her level of distress was much lower after meeting with all of the physicians and getting more information on her treatment plan.  CSW informed pt of the support team and support programs at [facility], and pt was agreeable to an [employer] referral.  CSW encouraged pt to call with any questions or concerns.    Tamala Julian, MSW, LCSW Clinical Social Worker [facility] [phone]

## 2013-09-04 ENCOUNTER — Encounter (HOSPITAL_BASED_OUTPATIENT_CLINIC_OR_DEPARTMENT_OTHER): Payer: Self-pay | Admitting: *Deleted

## 2013-09-04 ENCOUNTER — Other Ambulatory Visit (INDEPENDENT_AMBULATORY_CARE_PROVIDER_SITE_OTHER): Payer: Self-pay | Admitting: Surgery

## 2013-09-04 DIAGNOSIS — N6099 Unspecified benign mammary dysplasia of unspecified breast: Secondary | ICD-10-CM

## 2013-09-04 NOTE — Progress Notes (Signed)
Had cbc cmet [facility] 08/27/13-

## 2013-09-05 ENCOUNTER — Encounter: Payer: Self-pay | Admitting: *Deleted

## 2013-09-05 NOTE — Progress Notes (Signed)
Mailed after appt letter to pt. 

## 2013-09-10 ENCOUNTER — Ambulatory Visit (HOSPITAL_BASED_OUTPATIENT_CLINIC_OR_DEPARTMENT_OTHER)
Admission: RE | Admit: 2013-09-10 | Discharge: 2013-09-10 | Disposition: A | Discharge status: Home / Self Care | Payer: BC Managed Care – PPO | Source: Ambulatory Visit | Attending: Surgery | Admitting: Surgery

## 2013-09-10 ENCOUNTER — Encounter (HOSPITAL_BASED_OUTPATIENT_CLINIC_OR_DEPARTMENT_OTHER): Payer: Self-pay | Admitting: *Deleted

## 2013-09-10 ENCOUNTER — Ambulatory Visit
Admission: RE | Admit: 2013-09-10 | Discharge: 2013-09-10 | Disposition: A | Discharge status: Home / Self Care | Payer: BC Managed Care – PPO | Source: Ambulatory Visit | Attending: Surgery | Admitting: Surgery

## 2013-09-10 ENCOUNTER — Ambulatory Visit (HOSPITAL_BASED_OUTPATIENT_CLINIC_OR_DEPARTMENT_OTHER): Payer: BC Managed Care – PPO | Admitting: Anesthesiology

## 2013-09-10 ENCOUNTER — Encounter (HOSPITAL_BASED_OUTPATIENT_CLINIC_OR_DEPARTMENT_OTHER)
Admission: RE | Disposition: A | Discharge status: Home / Self Care | Payer: Self-pay | Source: Ambulatory Visit | Attending: Surgery

## 2013-09-10 ENCOUNTER — Encounter (HOSPITAL_BASED_OUTPATIENT_CLINIC_OR_DEPARTMENT_OTHER): Payer: BC Managed Care – PPO | Admitting: Anesthesiology

## 2013-09-10 DIAGNOSIS — D0590 Unspecified type of carcinoma in situ of unspecified breast: Secondary | ICD-10-CM

## 2013-09-10 DIAGNOSIS — N6089 Other benign mammary dysplasias of unspecified breast: Secondary | ICD-10-CM | POA: Insufficient documentation

## 2013-09-10 DIAGNOSIS — B1920 Unspecified viral hepatitis C without hepatic coma: Secondary | ICD-10-CM | POA: Insufficient documentation

## 2013-09-10 DIAGNOSIS — D0512 Intraductal carcinoma in situ of left breast: Secondary | ICD-10-CM

## 2013-09-10 DIAGNOSIS — N6099 Unspecified benign mammary dysplasia of unspecified breast: Secondary | ICD-10-CM

## 2013-09-10 DIAGNOSIS — Z87891 Personal history of nicotine dependence: Secondary | ICD-10-CM | POA: Insufficient documentation

## 2013-09-10 HISTORY — DX: Psoriasis, unspecified: L40.9

## 2013-09-10 HISTORY — PX: BREAST LUMPECTOMY WITH NEEDLE LOCALIZATION: SHX5759

## 2013-09-10 HISTORY — DX: Esophageal varices without bleeding: I85.00

## 2013-09-10 HISTORY — DX: Presence of spectacles and contact lenses: Z97.3

## 2013-09-10 SURGERY — BREAST LUMPECTOMY WITH NEEDLE LOCALIZATION
Anesthesia: General | Site: Breast | Laterality: Left

## 2013-09-10 MED ORDER — BUPIVACAINE-EPINEPHRINE 0.25% -1:200000 IJ SOLN
INTRAMUSCULAR | Status: DC | PRN
  Administered 2013-09-10: 20 mL

## 2013-09-10 MED ORDER — FENTANYL CITRATE 0.05 MG/ML IJ SOLN
INTRAMUSCULAR | Status: DC | PRN
  Administered 2013-09-10: 100 ug via INTRAVENOUS

## 2013-09-10 MED ORDER — PROMETHAZINE HCL 25 MG RE SUPP
RECTAL | Status: AC
  Filled 2013-09-10: qty 1

## 2013-09-10 MED ORDER — MIDAZOLAM HCL 2 MG/2ML IJ SOLN: 1.0000 mg | INTRAMUSCULAR | Status: DC | PRN

## 2013-09-10 MED ORDER — DEXTROSE 5 % IV SOLN
3.0000 g | INTRAVENOUS | Status: AC
  Administered 2013-09-10: 2 g via INTRAVENOUS

## 2013-09-10 MED ORDER — ONDANSETRON HCL 4 MG/2ML IJ SOLN
INTRAMUSCULAR | Status: DC | PRN
  Administered 2013-09-10: 4 mg via INTRAVENOUS

## 2013-09-10 MED ORDER — HYDROMORPHONE HCL PF 1 MG/ML IJ SOLN
INTRAMUSCULAR | Status: AC
  Filled 2013-09-10: qty 1

## 2013-09-10 MED ORDER — LACTATED RINGERS IV SOLN
INTRAVENOUS | Status: DC
  Administered 2013-09-10 (×2): via INTRAVENOUS

## 2013-09-10 MED ORDER — BUPIVACAINE-EPINEPHRINE PF 0.25-1:200000 % IJ SOLN
INTRAMUSCULAR | Status: AC
  Filled 2013-09-10: qty 30

## 2013-09-10 MED ORDER — SODIUM CHLORIDE 0.9 % IJ SOLN
INTRAMUSCULAR | Status: AC
  Filled 2013-09-10: qty 10

## 2013-09-10 MED ORDER — OXYCODONE HCL 5 MG/5ML PO SOLN: 5.0000 mg | Freq: Once | ORAL | Status: DC | PRN

## 2013-09-10 MED ORDER — EPHEDRINE SULFATE 50 MG/ML IJ SOLN
INTRAMUSCULAR | Status: DC | PRN
  Administered 2013-09-10: 5 mg via INTRAVENOUS
  Administered 2013-09-10: 10 mg via INTRAVENOUS
  Administered 2013-09-10: 5 mg via INTRAVENOUS

## 2013-09-10 MED ORDER — HYDROMORPHONE HCL PF 1 MG/ML IJ SOLN
0.2500 mg | INTRAMUSCULAR | Status: DC | PRN
  Administered 2013-09-10: 0.25 mg via INTRAVENOUS
  Administered 2013-09-10: 0.5 mg via INTRAVENOUS

## 2013-09-10 MED ORDER — MIDAZOLAM HCL 2 MG/2ML IJ SOLN
INTRAMUSCULAR | Status: AC
  Filled 2013-09-10: qty 2

## 2013-09-10 MED ORDER — FENTANYL CITRATE 0.05 MG/ML IJ SOLN: 50.0000 ug | INTRAMUSCULAR | Status: DC | PRN

## 2013-09-10 MED ORDER — OXYCODONE HCL 5 MG PO TABS: 5.0000 mg | ORAL_TABLET | Freq: Once | ORAL | Status: DC | PRN

## 2013-09-10 MED ORDER — DEXTROSE 5 % IV SOLN: 3.0000 g | INTRAVENOUS | Status: DC

## 2013-09-10 MED ORDER — DEXAMETHASONE SODIUM PHOSPHATE 4 MG/ML IJ SOLN
INTRAMUSCULAR | Status: DC | PRN
  Administered 2013-09-10: 10 mg via INTRAVENOUS

## 2013-09-10 MED ORDER — MIDAZOLAM HCL 5 MG/5ML IJ SOLN
INTRAMUSCULAR | Status: DC | PRN
  Administered 2013-09-10: 2 mg via INTRAVENOUS

## 2013-09-10 MED ORDER — FENTANYL CITRATE 0.05 MG/ML IJ SOLN
INTRAMUSCULAR | Status: AC
  Filled 2013-09-10: qty 6

## 2013-09-10 MED ORDER — HYDROCODONE-ACETAMINOPHEN 5-325 MG PO TABS: 1.0000 | ORAL_TABLET | Freq: Four times a day (QID) | ORAL | Status: DC | PRN

## 2013-09-10 MED ORDER — PROPOFOL 10 MG/ML IV BOLUS
INTRAVENOUS | Status: DC | PRN
  Administered 2013-09-10: 200 mg via INTRAVENOUS

## 2013-09-10 MED ORDER — METHYLENE BLUE 1 % INJ SOLN
INTRAMUSCULAR | Status: AC
  Filled 2013-09-10: qty 10

## 2013-09-10 MED ORDER — CEFAZOLIN SODIUM 1-5 GM-% IV SOLN
INTRAVENOUS | Status: AC
  Filled 2013-09-10: qty 150

## 2013-09-10 MED ORDER — CHLORHEXIDINE GLUCONATE 4 % EX LIQD: 1.0000 "application " | Freq: Once | CUTANEOUS | Status: DC

## 2013-09-10 MED ORDER — LIDOCAINE HCL (CARDIAC) 20 MG/ML IV SOLN
INTRAVENOUS | Status: DC | PRN
  Administered 2013-09-10: 50 mg via INTRAVENOUS

## 2013-09-10 SURGICAL SUPPLY — 49 items
ADH SKN CLS APL DERMABOND .7 (GAUZE/BANDAGES/DRESSINGS) ×1
APPLIER CLIP 11 MED OPEN (CLIP) ×2
APR CLP MED 11 20 MLT OPN (CLIP) ×1
BINDER BREAST LRG (GAUZE/BANDAGES/DRESSINGS) ×2 IMPLANT
BINDER BREAST MEDIUM (GAUZE/BANDAGES/DRESSINGS) IMPLANT
BINDER BREAST XLRG (GAUZE/BANDAGES/DRESSINGS) IMPLANT
BINDER BREAST XXLRG (GAUZE/BANDAGES/DRESSINGS) IMPLANT
BLADE SURG 15 STRL LF DISP TIS (BLADE) ×1 IMPLANT
BLADE SURG 15 STRL SS (BLADE) ×2
CANISTER SUCT 1200ML W/VALVE (MISCELLANEOUS) ×2 IMPLANT
CHLORAPREP W/TINT 26ML (MISCELLANEOUS) ×2 IMPLANT
CLIP APPLIE 11 MED OPEN (CLIP) ×1 IMPLANT
CLIP TI WIDE RED SMALL 6 (CLIP) IMPLANT
COVER MAYO STAND STRL (DRAPES) ×2 IMPLANT
COVER TABLE BACK 60X90 (DRAPES) ×2 IMPLANT
DECANTER SPIKE VIAL GLASS SM (MISCELLANEOUS) ×2 IMPLANT
DERMABOND ADVANCED (GAUZE/BANDAGES/DRESSINGS) ×1
DERMABOND ADVANCED .7 DNX12 (GAUZE/BANDAGES/DRESSINGS) ×1 IMPLANT
DEVICE DUBIN W/COMP PLATE 8390 (MISCELLANEOUS) ×4 IMPLANT
DRAPE LAPAROSCOPIC ABDOMINAL (DRAPES) IMPLANT
DRAPE PED LAPAROTOMY (DRAPES) ×2 IMPLANT
DRAPE UTILITY XL STRL (DRAPES) ×2 IMPLANT
ELECT COATED BLADE 2.86 ST (ELECTRODE) ×2 IMPLANT
ELECT REM PT RETURN 9FT ADLT (ELECTROSURGICAL) ×2
ELECTRODE REM PT RTRN 9FT ADLT (ELECTROSURGICAL) ×1 IMPLANT
GLOVE BIOGEL PI IND STRL 7.0 (GLOVE) ×1 IMPLANT
GLOVE BIOGEL PI IND STRL 8 (GLOVE) ×1 IMPLANT
GLOVE BIOGEL PI INDICATOR 7.0 (GLOVE) ×1
GLOVE BIOGEL PI INDICATOR 8 (GLOVE) ×1
GLOVE ECLIPSE 6.5 STRL STRAW (GLOVE) ×2 IMPLANT
GLOVE ECLIPSE 8.0 STRL XLNG CF (GLOVE) ×2 IMPLANT
GLOVE EXAM NITRILE LRG STRL (GLOVE) ×2 IMPLANT
GOWN PREVENTION PLUS XLARGE (GOWN DISPOSABLE) ×4 IMPLANT
KIT MARKER MARGIN INK (KITS) ×1 IMPLANT
NEEDLE HYPO 25X1 1.5 SAFETY (NEEDLE) ×2 IMPLANT
NS IRRIG 1000ML POUR BTL (IV SOLUTION) ×2 IMPLANT
PACK BASIN DAY SURGERY FS (CUSTOM PROCEDURE TRAY) ×2 IMPLANT
PENCIL BUTTON HOLSTER BLD 10FT (ELECTRODE) ×2 IMPLANT
SLEEVE SCD COMPRESS KNEE MED (MISCELLANEOUS) ×2 IMPLANT
SPONGE LAP 4X18 X RAY DECT (DISPOSABLE) IMPLANT
SUT MON AB 4-0 PC3 18 (SUTURE) ×4 IMPLANT
SUT SILK 2 0 SH (SUTURE) IMPLANT
SUT VIC AB 3-0 SH 27 (SUTURE) ×4
SUT VIC AB 3-0 SH 27X BRD (SUTURE) ×2 IMPLANT
SYR CONTROL 10ML LL (SYRINGE) ×2 IMPLANT
TOWEL OR 17X24 6PK STRL BLUE (TOWEL DISPOSABLE) ×4 IMPLANT
TOWEL OR NON WOVEN STRL DISP B (DISPOSABLE) ×2 IMPLANT
TUBE CONNECTING 20X1/4 (TUBING) ×2 IMPLANT
YANKAUER SUCT BULB TIP NO VENT (SUCTIONS) ×2 IMPLANT

## 2013-09-10 NOTE — Op Note (Signed)
Preoperative diagnosis: #1 left breast DCIS #2 left breast atypical ductal hyperplasia  Postoperative diagnosis: Same  Procedure: Left breast needle localized lumpectomy x2 with grossly negative margins  Surgeon: Harriette Bouillon M.D.  Anesthesia: LMA with 0.25% Sensorcaine with epinephrine  EBL: Minimal  Drains: None  Indications for procedure: The patient is a 52 year old female was found to have a focus of left breast DCISand a second focus of atypical ductal hyperplasia in the left breast. She was seen in the multidisciplinary breast clinic. After discussion and consultation, she desired breast conservation. Both areas were felt to be amendable to lumpectomy with minimal cosmetic deformity. This was discussed with the patient. Risks, benefits and alternative therapies discussed. Long term expectations and adjuvant therapies discussed with the patient. Possibility of needing a vasectomy discussed with the patient. Cosmetic deformity discussed with the patient. After the above she was to proceed.The procedure has been discussed with the patient. Alternatives to surgery have been discussed with the patient.  Risks of surgery include bleeding,  Infection,  Seroma formation, death,  and the need for further surgery.   The patient understands and wishes to proceed.  Description of procedure: After undergoing left breast needle localization x2 by radiology, the patient was met in the holding area and questions are answered. Left breast was marked. She's taken back to the operating room placed upon the OR table. After induction of LMA anesthesia, left breast was prepped and draped in a sterile fashion. Timeout was done and she received for her antibiotics. The lateral wire was addressed first which was the DCIS. Curvilinear incision was made in the upper outer quadrant of left breast near wire. All tissue around the tip of the wire was excised and radiograph revealed clip wire and calcifications to be in  the specimen. This closed the deep layer of 3-0 Vicryl and 4-0 Monocryl. Clips are placed prior to closure.  The medial left breast lesion was approached in similar fashion. Transverse incision was made to the wire at 9:00. All tissue excise around tip of wire. Radiograph revealed this to show clip and wire. Balloon was irrigated and made hemostatic with cautery. Incision closed with 3-0 Vicryl and 4-0 Monocryl. Dermabond applied to both incisions. Margins grossly negative for both specimens. The patient was in awoke extubated taken to recovery in satisfactory condition. All final counts are found to be correct.

## 2013-09-10 NOTE — Transfer of Care (Signed)
Immediate Anesthesia Transfer of Care Note  Patient: Connie [name]  Procedure(s) Performed: Procedure(s): BREAST LUMPECTOMY WITH NEEDLE LOCALIZATION (two) (Left)  Patient Location: PACU  Anesthesia Type:General  Level of Consciousness: awake and alert   Airway & Oxygen Therapy: Patient Spontanous Breathing and Patient connected to face mask oxygen  Post-op Assessment: Report given to PACU RN and Post -op Vital signs reviewed and stable  Post vital signs: Reviewed and stable  Complications: No apparent anesthesia complications

## 2013-09-10 NOTE — Anesthesia Preprocedure Evaluation (Addendum)
Anesthesia Evaluation  Patient identified by MRN, date of birth, ID band Patient awake    Reviewed: Allergy & Precautions, H&P , NPO status , Patient's Chart, lab work & pertinent test results, reviewed documented beta blocker date and time   Airway Mallampati: II TM Distance: >3 FB Neck ROM: Full    Dental no notable dental hx. (+) Teeth Intact and Dental Advisory Given   Pulmonary neg pulmonary ROS, former smoker,  breath sounds clear to auscultation  Pulmonary exam normal       Cardiovascular negative cardio ROS  Rhythm:Regular Rate:Normal     Neuro/Psych negative neurological ROS  negative psych ROS   GI/Hepatic negative GI ROS, (+) Hepatitis -, C  Endo/Other  negative endocrine ROS  Renal/GU negative Renal ROS  negative genitourinary   Musculoskeletal   Abdominal   Peds  Hematology negative hematology ROS (+)   Anesthesia Other Findings   Reproductive/Obstetrics negative OB ROS                          Anesthesia Physical Anesthesia Plan  ASA: II  Anesthesia Plan: General   Post-op Pain Management:    Induction: Intravenous  Airway Management Planned: LMA  Additional Equipment:   Intra-op Plan:   Post-operative Plan: Extubation in OR  Informed Consent: I have reviewed the patients History and Physical, chart, labs and discussed the procedure including the risks, benefits and alternatives for the proposed anesthesia with the patient or authorized representative who has indicated his/her understanding and acceptance.   Dental advisory given  Plan Discussed with: CRNA  Anesthesia Plan Comments:         Anesthesia Quick Evaluation

## 2013-09-10 NOTE — Anesthesia Procedure Notes (Signed)
Procedure Name: LMA Insertion Date/Time: 09/10/2013 11:55 AM Performed by: Caren Macadam Pre-anesthesia Checklist: Patient identified, Emergency Drugs available, Suction available and Patient being monitored Patient Re-evaluated:Patient Re-evaluated prior to inductionOxygen Delivery Method: Circle System Utilized Preoxygenation: Pre-oxygenation with 100% oxygen Intubation Type: IV induction Ventilation: Mask ventilation without difficulty LMA: LMA inserted LMA Size: 4.0 Number of attempts: 1 Airway Equipment and Method: bite block Placement Confirmation: positive ETCO2 and breath sounds checked- equal and bilateral Tube secured with: Tape Dental Injury: Teeth and Oropharynx as per pre-operative assessment

## 2013-09-10 NOTE — Interval H&P Note (Signed)
History and Physical Interval Note:  09/10/2013 11:25 AM  Connie [name]  has presented today for surgery, with the diagnosis of left dcis   The various methods of treatment have been discussed with the patient and family. After consideration of risks, benefits and other options for treatment, the patient has consented to  Procedure(s) with comments: BREAST LUMPECTOMY WITH NEEDLE LOCALIZATION (Left) - NL BCG 9  as a surgical intervention .  The patient's history has been reviewed, patient examined, no change in status, stable for surgery.  I have reviewed the patient's chart and labs.  Questions were answered to the patient's satisfaction.   Will localize both areas and remove.    Copeland Lapier A.

## 2013-09-10 NOTE — H&P (View-Only) (Signed)
Patient ID: Connie [name], female   DOB: April 09, 1961, 52 y.o.   MRN: [id number]  No chief complaint on file.   HPI Connie [name] is a 52 y.o. female.  PT PRESENTS AT REQUEST OF DR GREEN FOR DCIS LEFT BREAST .   FOUND ON MAMMOGRAM.  2 ND AREA 1.2 CM PENDING BIOPSY. NO COMPLAINTS.  NO MASS.   HPI  Past Medical History  Diagnosis Date  . Hepatitis C     Past Surgical History  Procedure Laterality Date  . [place]       x2    No family history on file.  Social History History  Substance Use Topics  . Smoking status: Former Smoker -- 0.50 packs/day    Types: Cigarettes    Quit date: 08/20/2013  . Smokeless tobacco: Not on file  . Alcohol Use: No    No Known Allergies  Current Outpatient Prescriptions  Medication Sig Dispense Refill  . BIOTIN PO Take 1 tablet by mouth daily.      Marland Kitchen buPROPion (WELLBUTRIN XL) 300 MG 24 hr tablet Take 300 mg by mouth daily.      . calcium carbonate 200 MG capsule Take 250 mg by mouth 2 (two) times daily with a meal.      . prednisoLONE acetate (PRED FORTE) 1 % ophthalmic suspension Bid for 5 days then switch  5 mL  0  . Prenatal Vit-Fe Fumarate-FA (PRENATAL MULTIVITAMIN) TABS tablet Take 1 tablet by mouth daily.      . propranolol (INDERAL) 60 MG tablet Take 60 mg by mouth daily.       No current facility-administered medications for this visit.    Review of Systems Review of Systems  Constitutional: Negative for fever, chills and unexpected weight change.  HENT: Negative for congestion, hearing loss, sore throat, trouble swallowing and voice change.   Eyes: Negative for visual disturbance.  Respiratory: Negative for cough and wheezing.   Cardiovascular: Negative for chest pain, palpitations and leg swelling.  Gastrointestinal: Negative for nausea, vomiting, abdominal pain, diarrhea, constipation, blood in stool, abdominal distention and anal bleeding.  Genitourinary: Negative for hematuria, vaginal bleeding and difficulty urinating.   Musculoskeletal: Negative for arthralgias.  Skin: Negative for rash and wound.  Neurological: Negative for seizures, syncope and headaches.  Hematological: Negative for adenopathy. Does not bruise/bleed easily.  Psychiatric/Behavioral: Negative for confusion.    There were no vitals taken for this visit.  Physical Exam Physical Exam  Constitutional: She is oriented to person, place, and time. She appears well-developed and well-nourished.  HENT:  Head: Normocephalic and atraumatic.  Eyes: EOM are normal. Pupils are equal, round, and reactive to light.  Neck: Normal range of motion. Neck supple.  Cardiovascular: Normal rate and regular rhythm.   Pulmonary/Chest: Effort normal and breath sounds normal. Right breast exhibits no inverted nipple, no mass, no nipple discharge, no skin change and no tenderness. Left breast exhibits no inverted nipple, no mass, no nipple discharge, no skin change and no tenderness. Breasts are symmetrical.  Musculoskeletal: Normal range of motion.  Neurological: She is alert and oriented to person, place, and time.  Skin: Skin is warm and dry.  Psychiatric: She has a normal mood and affect. Her behavior is normal. Judgment and thought content normal.    Data Reviewed CLINICAL DATA: Recently diagnosed left breast 12 o'clock location  DCIS manifesting as calcifications detected at screening  mammography.  EXAM:  BILATERAL BREAST MRI WITH AND WITHOUT CONTRAST  LABS: BUN and creatinine  were obtained on site at [facility]  Imaging at  [address]  Results: BUN 18 mg/dL, Creatinine 0.8 mg/dL.  TECHNIQUE:  Multiplanar, multisequence MR images of both breasts were obtained  prior to and following the intravenous administration of 17ml of  MultiHance.  THREE-DIMENSIONAL MR IMAGE RENDERING ON INDEPENDENT WORKSTATION:  Three-dimensional MR images were rendered by post-processing of the  original MR data on an independent workstation. The   three-dimensional MR images were interpreted, and findings are  reported in the following complete MRI report for this study. Three  dimensional images were evaluated at the independent DynaCad  workstation  COMPARISON: Previous exams  FINDINGS:  Breast composition: b. Scattered fibroglandular tissue  Background parenchymal enhancement: Mild  Right breast: No mass or abnormal enhancement.  Left breast: Post biopsy changes noted in the left breast 12 o'clock  location at the site of recent biopsy proven DCIS. There is an area  of clumped nodular enhancement with wash-in/ washout type  enhancement kinetics with central clip artifact in this region,  measuring 1.7 x 1.4 x 1.0 cm. This corresponds to the area of  calcifications seen on the prior exams.  1.2 cm inferior and medial to the area of biopsy proven DCIS, in the  central breast 9 o'clock location, is an irregular area of  enhancement measuring 1.0 x 0.7 x 0.6 cm, also demonstrating  wash-in/ washout type enhancement kinetics.  Lymph nodes: No abnormal appearing lymph nodes.  Ancillary findings: Mild inhomogeneous marrow signal is noted within  the sternum without focal lesion identified.  IMPRESSION:  1.7 cm area of abnormal clumped nodular enhancement at the site of  biopsy proven left breast 12 o'clock location DCIS.  2 additional area of abnormal enhancement measuring 1 cm maximally  in the left central breast/ 9 o'clock location is identified. This  area is felt to most likely be sonographically occult, therefore MRI  guided core biopsy is recommended and the patient will be contacted  to schedule this PROCEDURE.  No MRI evidence for malignancy in the right breast.  RECOMMENDATION:  Left MRI guided core biopsy  BI-RADS CATEGORY 6: Known biopsy-proven malignancy - appropriate  action should be taken.  Electronically Signed  By: Christiana Pellant M.D.  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  AWAIT SECOND BIOPSY TO DETERMINE LUMPECTOMY SIZE. The procedure has been discussed with the patient. Alternatives to surgery have been discussed with the patient.  Risks of surgery include bleeding,  Infection,  Seroma formation, death,  and the need for further surgery.   The patient understands and wishes to proceed.       Connie Kirstein A. 08/27/2013, 11:08 AM

## 2013-09-10 NOTE — Anesthesia Postprocedure Evaluation (Signed)
  Anesthesia Post-op Note  Patient: Connie [name]  Procedure(s) Performed: Procedure(s): BREAST LUMPECTOMY WITH NEEDLE LOCALIZATION (two) (Left)  Patient Location: PACU  Anesthesia Type:General  Level of Consciousness: awake and alert   Airway and Oxygen Therapy: Patient Spontanous Breathing  Post-op Pain: mild  Post-op Assessment: Post-op Vital signs reviewed, Patient's Cardiovascular Status Stable and Respiratory Function Stable  Post-op Vital Signs: Reviewed  Filed Vitals:   09/10/13 1412  BP: 117/62  Pulse: 68  Temp: 36.6 C  Resp: 16    Complications: No apparent anesthesia complications

## 2013-09-15 ENCOUNTER — Telehealth (INDEPENDENT_AMBULATORY_CARE_PROVIDER_SITE_OTHER): Payer: Self-pay

## 2013-09-15 ENCOUNTER — Encounter (HOSPITAL_BASED_OUTPATIENT_CLINIC_OR_DEPARTMENT_OTHER): Payer: Self-pay | Admitting: Surgery

## 2013-09-15 NOTE — Telephone Encounter (Signed)
Called pt and gave path results. Clear margins

## 2013-09-15 NOTE — Telephone Encounter (Signed)
Message copied by Brennan Bailey on Mon Sep 15, 2013  1:17 PM ------      Message from: Harriette Bouillon A      Created: Mon Sep 15, 2013 12:21 PM       Margins clear ------

## 2013-09-18 DIAGNOSIS — Z923 Personal history of irradiation: Secondary | ICD-10-CM

## 2013-09-18 HISTORY — DX: Personal history of irradiation: Z92.3

## 2013-09-22 ENCOUNTER — Encounter: Payer: Self-pay | Admitting: *Deleted

## 2013-09-22 NOTE — Progress Notes (Signed)
Location of Breast Cancer:Left Breast ( Upper Outer Quadrant)  Histology per Pathology Report:  09/10/13 Diagnosis 1. 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 - MARGINS NOT INVOLVED. - DCIS 0.1 CM FROM LATERAL MARGIN. - EXTENSIVE BIOPSY SITE REACTION. 2. Breast, lumpectomy, Left, wire #2 - FIBROCYSTIC CHANGES WITH CALCIFICATIONS. - EXTENSIVE BIOPSY SITE REACTION. - NO MALIGNANCY IDENTIFIED. Microscopic Comment  09/02/13 Diagnosis Breast, left, needle core biopsy, 9 o'clock - FIBROCYSTIC CHANGES WITH USUAL DUCTAL HYPERPLASIA AND ASSOCIATED CALCIFICATIONS. - FOCAL LOBULAR NEOPLASIA (ATYPICAL LOBULAR HYPERPLASIA) PRESENT.  08/18/13 Diagnosis 1. Breast, left, needle core biopsy, upper - DUCTAL CARCINOMA IN SITU WITH CALCIFICATION AND NECROSIS. - SEE COMMENT. 2. Breast, left, needle core biopsy, upper - FIBROADENOMA. - FIBROCYSTIC CHANGES. - NO ATYPIA, HYPERPLASIA, OR MALIGNANCY IDENTIFIED  Receptor Status: ER(100%), PR (86%), Her2-neu )  Found on screening mammography?:routine screening bilateral mammography at the breast center 07/15/2013 showing calcifications in the left breast which call for further evaluation. On 08/12/2013 limited diagnostic mammography of the left breast confirmed a group of calcifications in the upper outer quadrant spanning a distance of approximately 9 mm.   Past/Anticipated interventions by surgeon, if any: Lumpectomy Left Breast  Past/Anticipated interventions by medical oncology, if any:  Anti-estrogen Therapy  Lymphedema issues, if any:    Pain issues, if any:   SAFETY ISSUES:  Prior radiation? No  Pacemaker/ICD? NO  Possible current pregnancy? No Is the patient on methotrexate? NO  Current Complaints / other details:

## 2013-09-23 ENCOUNTER — Other Ambulatory Visit: Payer: Self-pay | Admitting: Nurse Practitioner

## 2013-09-23 ENCOUNTER — Other Ambulatory Visit (HOSPITAL_COMMUNITY)
Admission: RE | Admit: 2013-09-23 | Discharge: 2013-09-23 | Disposition: A | Discharge status: Home / Self Care | Payer: BC Managed Care – PPO | Source: Ambulatory Visit | Attending: Nurse Practitioner | Admitting: Nurse Practitioner

## 2013-09-23 DIAGNOSIS — Z1151 Encounter for screening for human papillomavirus (HPV): Secondary | ICD-10-CM | POA: Insufficient documentation

## 2013-09-23 DIAGNOSIS — Z01419 Encounter for gynecological examination (general) (routine) without abnormal findings: Secondary | ICD-10-CM | POA: Insufficient documentation

## 2013-09-25 ENCOUNTER — Encounter: Payer: Self-pay | Admitting: *Deleted

## 2013-09-25 ENCOUNTER — Ambulatory Visit
Admission: RE | Admit: 2013-09-25 | Discharge: 2013-09-25 | Disposition: A | Discharge status: Home / Self Care | Payer: BC Managed Care – PPO | Source: Ambulatory Visit | Attending: Radiation Oncology | Admitting: Radiation Oncology

## 2013-09-25 ENCOUNTER — Encounter: Payer: Self-pay | Admitting: Radiation Oncology

## 2013-09-25 VITALS — BP 114/76 | HR 71 | Temp 98.5°F | Ht 66.0 in | Wt 192.3 lb

## 2013-09-25 DIAGNOSIS — C50412 Malignant neoplasm of upper-outer quadrant of left female breast: Secondary | ICD-10-CM

## 2013-09-25 NOTE — Progress Notes (Signed)
   Department of Radiation Oncology  Phone:  [phone] Fax:        [phone]   Name: Connie [name] MRN: [id number]  DOB: 11-12-1960  Date: 09/25/2013  Follow Up Visit Note  Diagnosis: DCIS of the left breast  Interval History: Connie [name] presents today for routine followup.  She had her lumpectomy which showed 0.2 cm of DCIS with negative margins although a lateral margin was close at 1 mm. The second lumpectomy showed fibrocystic change with calcifications with no malignancy identified. The DCIS was ER positive at 100% PR positive at 86%. She meets with her surgeon on the 19th. She would like to pursue radiation here in [place]. She has healed up well. She has noted that left breast is slightly swollen. She is back to work.  Allergies: No Known Allergies  Medications:  Current Outpatient Prescriptions  Medication Sig Dispense Refill  . BIOTIN PO Take 1 tablet by mouth daily.      Marland Kitchen buPROPion (WELLBUTRIN XL) 300 MG 24 hr tablet Take 100 mg by mouth 2 (two) times daily.       Marland Kitchen erythromycin (ERYTHROCIN) 250 MG tablet Take 400 mg by mouth 2 (two) times daily.      . Prenatal Vit-Fe Fumarate-FA (PRENATAL MULTIVITAMIN) TABS tablet Take 1 tablet by mouth daily.      . propranolol (INDERAL) 60 MG tablet Take 60 mg by mouth daily. Takes for esoph varies from having hep c-treated now      . triamcinolone cream (KENALOG) 0.1 % Apply 1 application topically 2 (two) times daily. Mixed with Cetaphil       No current facility-administered medications for this encounter.    Physical Exam:  Filed Vitals:   09/25/13 1001  BP: 114/76  Pulse: 71  Temp: 98.5 F (36.9 C)  Height: 5\' 6"  (1.676 m)  Weight: 192 lb 4.8 oz (87.227 kg)   large pendulous breasts bilaterally. Alert and oriented x3.  IMPRESSION: Connie [name] is a 53 y.o. female status post lumpectomy for DCIS  PLAN:  I would like to clarify this margin and make sure this was focally positive and not probably positive. As long as it focal I  think we can proceed on with radiation. I would also like to perform a pre-RT mammogram to ensure all calcifications been removed especially given the question of her other imaging which turned out to be negative on lumpectomy. We discussed the process of simulation the placement tattoos. We discussed 4-6 weeks of treatment as an outpatient. We discussed skin redness and fatigue as possible side effects. We discussed the process of simulation the placement tattoos. We discussed the low rate of symptomatic heart and lung damage as well as a low rate of secondary malignancies. We discussed the possible use of cardiac sparing using a breath hold technique. She is signed informed consent and agree to proceed forward. I schedule her for simulation on January 20 after she is seen by her surgeon.    Thea Silversmith, MD

## 2013-09-25 NOTE — Addendum Note (Signed)
Encounter addended by: Thea Silversmith, MD on: 09/25/2013  1:25 PM<BR>     Documentation filed: Follow-up Section, LOS Section, Clinical Notes, Notes Section, Orders

## 2013-09-26 ENCOUNTER — Encounter: Payer: Self-pay | Admitting: *Deleted

## 2013-09-26 NOTE — Progress Notes (Signed)
[place] Psychosocial Distress Screening Clinical Social Work  Clinical Social Work was referred by distress screening protocol.  The patient scored a 7 on the Psychosocial Distress Thermometer which indicates moderate distress. Pt was screened at breast clinic and scored a 10 last month.  Clinical Social Worker phoned Pt to assess for distress and other psychosocial needs. Pt at work, but would like to talk and plans to call CSW back later today.    Clinical Social Worker follow up needed: yes  If yes, follow up plan: Await return call  Connie [name], Strong Worker Connie [name]. [place] for [place] Wednesday, Thursday and Friday Phone: [phone] Fax: [phone]

## 2013-10-06 ENCOUNTER — Encounter (INDEPENDENT_AMBULATORY_CARE_PROVIDER_SITE_OTHER): Payer: Self-pay | Admitting: Surgery

## 2013-10-06 ENCOUNTER — Ambulatory Visit (INDEPENDENT_AMBULATORY_CARE_PROVIDER_SITE_OTHER): Payer: BC Managed Care – PPO | Admitting: Surgery

## 2013-10-06 VITALS — BP 116/72 | HR 72 | Temp 97.8°F | Resp 14 | Ht 66.0 in | Wt 186.4 lb

## 2013-10-06 DIAGNOSIS — Z9889 Other specified postprocedural states: Secondary | ICD-10-CM

## 2013-10-06 NOTE — Patient Instructions (Signed)
RETURN 6 MONTHS.  RESUME FULL ACTIVITY.

## 2013-10-06 NOTE — Progress Notes (Signed)
Patient returns after left breast lumpectomy. This is for DCIS. Margins are clear. There was one close margin. She's having no problems.  Exam: Incision left breast clean dry and intact.  1. 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 - MARGINS NOT INVOLVED. - DCIS 0.1 CM FROM LATERAL MARGIN. - EXTENSIVE BIOPSY SITE REACTION. 2. Breast, lumpectomy, Left, wire #2 - FIBROCYSTIC CHANGES WITH CALCIFICATIONS. - EXTENSIVE BIOPSY SITE REACTION. - NO MALIGNANCY IDENTIFIED. Microscopic Comment  Impression: Status post left breast lumpectomy for DCIS  Plan: Continue followup with radiation oncology. Continue follow up with medical oncology. Return 6 months for follow up with me.

## 2013-10-07 ENCOUNTER — Ambulatory Visit: Payer: BC Managed Care – PPO | Admitting: Radiation Oncology

## 2013-10-09 ENCOUNTER — Other Ambulatory Visit: Payer: Self-pay | Admitting: Gastroenterology

## 2013-10-14 ENCOUNTER — Ambulatory Visit
Admission: RE | Admit: 2013-10-14 | Discharge: 2013-10-14 | Disposition: A | Discharge status: Home / Self Care | Payer: BC Managed Care – PPO | Source: Ambulatory Visit | Attending: Radiation Oncology | Admitting: Radiation Oncology

## 2013-10-14 DIAGNOSIS — C50419 Malignant neoplasm of upper-outer quadrant of unspecified female breast: Secondary | ICD-10-CM | POA: Insufficient documentation

## 2013-10-14 DIAGNOSIS — C50412 Malignant neoplasm of upper-outer quadrant of left female breast: Secondary | ICD-10-CM

## 2013-10-14 DIAGNOSIS — Z510 Encounter for antineoplastic radiation therapy: Secondary | ICD-10-CM | POA: Insufficient documentation

## 2013-10-14 NOTE — Progress Notes (Signed)
Name: Connie [name]   MRN: [id number]  Date:  10/14/2013  DOB: 24-May-1961  Status:outpatient   DIAGNOSIS: Left Breast cancer.  CONSENT VERIFIED: yes SET UP: Patient is setup supine  IMMOBILIZATION:  The following immobilization was used:Custom Moldable Pillow, breast board.  NARRATIVE: [name] was brought to the [facility].  Identity was confirmed.  All relevant records and images related to the planned course of therapy were reviewed.  Then, the patient was positioned in a stable reproducible clinical set-up for radiation therapy.  Wires were placed to delineate the clinical extent of breast tissue. A wire was placed on the scar as well.  CT images were obtained.  An isocenter was placed. Skin markings were placed.  The position of the heart was then analyzed.  Due to the proximity of the heart to the chest wall, I felt she would benefit from deep inspiration breath hold for cardiac sparing.  She was then coached and rescanned in the breath hold position.  Acceptable cardiac sparing was achieved. The CT images were loaded into the planning software where the target and avoidance structures were contoured.  The radiation prescription was entered and confirmed. The patient was discharged in stable condition and tolerated simulation well.    TREATMENT PLANNING NOTE/3D Simulation Note Treatment planning then occurred. I have requested : MLC's, isodose plan, basic dose calculation  3D simulation was performed.  I personally designed and supervised the construction of 3 medically necessary complex treatment devices in the form of MLCs which will be used for beam modification and to protect critical structures including the heart and lung as well as the immobilization device which is necessary for reproducible set up.  I have requested a dose volume histogram of the heart, lung and tumor cavity.    RESPIRATORY MOTION MANAGEMENT SIMULATION - Deep Inspiration Breath Hold  NARRATIVE:   In order to account for effect of respiratory motion on target structures and other organs in the planning and delivery of radiotherapy, this patient underwent respiratory motion management simulation.  To accomplish this, when the patient was brought to the CT simulation planning suite, a bellows was placed on the her abdomen.  Wave forms of the patient's breathing were obtained. Coaching was performed and practice sessions initiated to monitor her ability to obtain and maintain deep inspiration breath hold.  The CT images were loaded into the planning software and fused with her free breathing images by physics.  Acceptable cardiac sparing was achieved through the use of deep inspiration breath hold.  Planning will be performed on her breath hold scan

## 2013-10-14 NOTE — Progress Notes (Signed)
Radiation Oncology         [phone] ________________________________  Name: Connie [name]      MRN: [id number]          Date: 10/14/2013              DOB: September 14, 1961  Optical Surface Tracking Plan:  Since intensity modulated radiotherapy (IMRT) and 3D conformal radiation treatment methods are predicated on accurate and precise positioning for treatment, intrafraction motion monitoring is medically necessary to ensure accurate and safe treatment delivery.  The ability to quantify intrafraction motion without excessive ionizing radiation dose can only be performed with optical surface tracking. Accordingly, surface imaging offers the opportunity to obtain 3D measurements of patient position throughout IMRT and 3D treatments without excessive radiation exposure.  I am ordering optical surface tracking for this patient's upcoming course of radiotherapy. ________________________________ Signature   Reference:   Ursula Alert, J, et al. Surface imaging-based analysis of intrafraction motion for breast radiotherapy patients.Journal of [place], n. 6, nov. 2014. ISSN 25427062.   Available at: <http://www.jacmp.org/index.php/jacmp/article/view/4957>.

## 2013-10-20 ENCOUNTER — Encounter: Payer: Self-pay | Admitting: Radiation Oncology

## 2013-10-21 ENCOUNTER — Ambulatory Visit
Admission: RE | Admit: 2013-10-21 | Discharge: 2013-10-21 | Disposition: A | Discharge status: Home / Self Care | Payer: BC Managed Care – PPO | Source: Ambulatory Visit | Attending: Radiation Oncology | Admitting: Radiation Oncology

## 2013-10-21 ENCOUNTER — Ambulatory Visit
Admission: RE | Admit: 2013-10-21 | Discharge: 2013-10-21 | Disposition: A | Discharge status: Home / Self Care | Payer: Self-pay | Source: Ambulatory Visit | Attending: Radiation Oncology | Admitting: Radiation Oncology

## 2013-10-21 ENCOUNTER — Telehealth: Payer: Self-pay | Admitting: *Deleted

## 2013-10-21 ENCOUNTER — Other Ambulatory Visit (INDEPENDENT_AMBULATORY_CARE_PROVIDER_SITE_OTHER): Payer: Self-pay | Admitting: Surgery

## 2013-10-21 DIAGNOSIS — D0510 Intraductal carcinoma in situ of unspecified breast: Secondary | ICD-10-CM

## 2013-10-21 DIAGNOSIS — C50412 Malignant neoplasm of upper-outer quadrant of left female breast: Secondary | ICD-10-CM

## 2013-10-21 NOTE — Progress Notes (Signed)
Patient had residual calcs on preRT mammo.  She has been in contact with Dr. Brantley Stage who is going to re-excise her. I spoke with her and will see her back for sim after her re-excision.

## 2013-10-21 NOTE — Telephone Encounter (Signed)
Pt called to inquire if she could take [facility] for psoriasis.  Per discussion with pt and review of chart this RN informed pt Bernette Mayers is not contraindicated with her known breast cancer diagnosis ( pt will not be getting chemotherapy ).  This RN did discuss with pt need to discuss use of humera with surgeon and radiation MD due to potential concerns.  This note will be given to MD.

## 2013-10-22 ENCOUNTER — Ambulatory Visit: Payer: BC Managed Care – PPO

## 2013-10-23 ENCOUNTER — Ambulatory Visit: Payer: BC Managed Care – PPO

## 2013-10-24 ENCOUNTER — Ambulatory Visit: Payer: BC Managed Care – PPO

## 2013-10-27 ENCOUNTER — Ambulatory Visit: Payer: BC Managed Care – PPO

## 2013-10-28 ENCOUNTER — Ambulatory Visit: Payer: BC Managed Care – PPO

## 2013-10-29 ENCOUNTER — Ambulatory Visit: Payer: BC Managed Care – PPO

## 2013-10-30 ENCOUNTER — Ambulatory Visit: Payer: BC Managed Care – PPO

## 2013-10-31 ENCOUNTER — Ambulatory Visit: Payer: BC Managed Care – PPO

## 2013-11-03 ENCOUNTER — Ambulatory Visit: Payer: BC Managed Care – PPO

## 2013-11-04 ENCOUNTER — Other Ambulatory Visit: Payer: Self-pay | Admitting: Family Medicine

## 2013-11-04 ENCOUNTER — Ambulatory Visit: Payer: BC Managed Care – PPO

## 2013-11-05 ENCOUNTER — Encounter (HOSPITAL_BASED_OUTPATIENT_CLINIC_OR_DEPARTMENT_OTHER): Payer: Self-pay | Admitting: *Deleted

## 2013-11-05 ENCOUNTER — Ambulatory Visit: Payer: BC Managed Care – PPO

## 2013-11-05 NOTE — Progress Notes (Signed)
Pt was here for lumpectomy 12/14-has some abn issue to take out=no labs needed

## 2013-11-06 ENCOUNTER — Ambulatory Visit: Payer: BC Managed Care – PPO

## 2013-11-07 ENCOUNTER — Ambulatory Visit: Payer: BC Managed Care – PPO

## 2013-11-10 ENCOUNTER — Ambulatory Visit: Payer: BC Managed Care – PPO

## 2013-11-11 ENCOUNTER — Encounter (HOSPITAL_BASED_OUTPATIENT_CLINIC_OR_DEPARTMENT_OTHER)
Admission: RE | Disposition: A | Discharge status: Home / Self Care | Payer: Self-pay | Source: Ambulatory Visit | Attending: Surgery

## 2013-11-11 ENCOUNTER — Ambulatory Visit (HOSPITAL_BASED_OUTPATIENT_CLINIC_OR_DEPARTMENT_OTHER): Payer: BC Managed Care – PPO | Admitting: Anesthesiology

## 2013-11-11 ENCOUNTER — Ambulatory Visit (HOSPITAL_BASED_OUTPATIENT_CLINIC_OR_DEPARTMENT_OTHER)
Admission: RE | Admit: 2013-11-11 | Discharge: 2013-11-11 | Disposition: A | Discharge status: Home / Self Care | Payer: BC Managed Care – PPO | Source: Ambulatory Visit | Attending: Surgery | Admitting: Surgery

## 2013-11-11 ENCOUNTER — Ambulatory Visit
Admission: RE | Admit: 2013-11-11 | Discharge: 2013-11-11 | Disposition: A | Discharge status: Home / Self Care | Payer: BC Managed Care – PPO | Source: Ambulatory Visit | Attending: Surgery | Admitting: Surgery

## 2013-11-11 ENCOUNTER — Encounter (HOSPITAL_BASED_OUTPATIENT_CLINIC_OR_DEPARTMENT_OTHER): Payer: BC Managed Care – PPO | Admitting: Anesthesiology

## 2013-11-11 ENCOUNTER — Encounter (HOSPITAL_BASED_OUTPATIENT_CLINIC_OR_DEPARTMENT_OTHER): Payer: Self-pay | Admitting: Certified Registered"

## 2013-11-11 ENCOUNTER — Ambulatory Visit: Payer: BC Managed Care – PPO

## 2013-11-11 DIAGNOSIS — Z87891 Personal history of nicotine dependence: Secondary | ICD-10-CM | POA: Insufficient documentation

## 2013-11-11 DIAGNOSIS — D0590 Unspecified type of carcinoma in situ of unspecified breast: Secondary | ICD-10-CM

## 2013-11-11 DIAGNOSIS — D0510 Intraductal carcinoma in situ of unspecified breast: Secondary | ICD-10-CM

## 2013-11-11 DIAGNOSIS — L408 Other psoriasis: Secondary | ICD-10-CM | POA: Insufficient documentation

## 2013-11-11 DIAGNOSIS — R920 Mammographic microcalcification found on diagnostic imaging of breast: Secondary | ICD-10-CM

## 2013-11-11 HISTORY — DX: Nausea with vomiting, unspecified: R11.2

## 2013-11-11 HISTORY — PX: BREAST LUMPECTOMY WITH NEEDLE LOCALIZATION: SHX5759

## 2013-11-11 HISTORY — DX: Other specified postprocedural states: Z98.890

## 2013-11-11 SURGERY — BREAST LUMPECTOMY WITH NEEDLE LOCALIZATION
Anesthesia: General | Site: Breast | Laterality: Left

## 2013-11-11 MED ORDER — MIDAZOLAM HCL 2 MG/2ML IJ SOLN: 1.0000 mg | INTRAMUSCULAR | Status: DC | PRN

## 2013-11-11 MED ORDER — FENTANYL CITRATE 0.05 MG/ML IJ SOLN: 50.0000 ug | INTRAMUSCULAR | Status: DC | PRN

## 2013-11-11 MED ORDER — CEFAZOLIN SODIUM-DEXTROSE 2-3 GM-% IV SOLR
INTRAVENOUS | Status: AC
  Filled 2013-11-11: qty 50

## 2013-11-11 MED ORDER — PROMETHAZINE HCL 25 MG/ML IJ SOLN
6.2500 mg | INTRAMUSCULAR | Status: DC | PRN
  Administered 2013-11-11: 6.25 mg via INTRAVENOUS

## 2013-11-11 MED ORDER — PROMETHAZINE HCL 25 MG/ML IJ SOLN
INTRAMUSCULAR | Status: AC
  Filled 2013-11-11: qty 1

## 2013-11-11 MED ORDER — HYDROMORPHONE HCL PF 1 MG/ML IJ SOLN
INTRAMUSCULAR | Status: AC
  Filled 2013-11-11: qty 1

## 2013-11-11 MED ORDER — BUPIVACAINE-EPINEPHRINE PF 0.25-1:200000 % IJ SOLN
INTRAMUSCULAR | Status: AC
  Filled 2013-11-11: qty 30

## 2013-11-11 MED ORDER — FENTANYL CITRATE 0.05 MG/ML IJ SOLN
INTRAMUSCULAR | Status: AC
  Filled 2013-11-11: qty 4

## 2013-11-11 MED ORDER — OXYCODONE HCL 5 MG PO TABS: 5.0000 mg | ORAL_TABLET | Freq: Once | ORAL | Status: DC | PRN

## 2013-11-11 MED ORDER — MIDAZOLAM HCL 2 MG/2ML IJ SOLN
INTRAMUSCULAR | Status: AC
  Filled 2013-11-11: qty 2

## 2013-11-11 MED ORDER — LACTATED RINGERS IV SOLN
INTRAVENOUS | Status: DC
  Administered 2013-11-11 (×3): via INTRAVENOUS

## 2013-11-11 MED ORDER — BUPIVACAINE-EPINEPHRINE 0.25% -1:200000 IJ SOLN
INTRAMUSCULAR | Status: DC | PRN
Start: 2013-11-11 — End: 2013-11-11
  Administered 2013-11-11: 20 mL

## 2013-11-11 MED ORDER — LIDOCAINE HCL (CARDIAC) 20 MG/ML IV SOLN
INTRAVENOUS | Status: DC | PRN
  Administered 2013-11-11: 80 mg via INTRAVENOUS

## 2013-11-11 MED ORDER — OXYCODONE HCL 5 MG/5ML PO SOLN: 5.0000 mg | Freq: Once | ORAL | Status: DC | PRN

## 2013-11-11 MED ORDER — PROPOFOL 10 MG/ML IV BOLUS
INTRAVENOUS | Status: DC | PRN
  Administered 2013-11-11: 170 mg via INTRAVENOUS

## 2013-11-11 MED ORDER — OXYCODONE HCL 5 MG PO TABS
ORAL_TABLET | ORAL | Status: AC
  Filled 2013-11-11: qty 1

## 2013-11-11 MED ORDER — FENTANYL CITRATE 0.05 MG/ML IJ SOLN
INTRAMUSCULAR | Status: DC | PRN
  Administered 2013-11-11: 100 ug via INTRAVENOUS

## 2013-11-11 MED ORDER — ONDANSETRON HCL 4 MG/2ML IJ SOLN
INTRAMUSCULAR | Status: DC | PRN
  Administered 2013-11-11: 4 mg via INTRAVENOUS

## 2013-11-11 MED ORDER — CEFAZOLIN SODIUM-DEXTROSE 2-3 GM-% IV SOLR
INTRAVENOUS | Status: DC | PRN
  Administered 2013-11-11: 2 g via INTRAVENOUS

## 2013-11-11 MED ORDER — DEXAMETHASONE SODIUM PHOSPHATE 4 MG/ML IJ SOLN
INTRAMUSCULAR | Status: DC | PRN
  Administered 2013-11-11: 10 mg via INTRAVENOUS

## 2013-11-11 MED ORDER — OXYCODONE-ACETAMINOPHEN 5-325 MG PO TABS: 1.0000 | ORAL_TABLET | ORAL | Status: DC | PRN

## 2013-11-11 MED ORDER — HYDROMORPHONE HCL PF 1 MG/ML IJ SOLN
0.2500 mg | INTRAMUSCULAR | Status: DC | PRN
  Administered 2013-11-11 (×2): 0.5 mg via INTRAVENOUS

## 2013-11-11 MED ORDER — MIDAZOLAM HCL 5 MG/5ML IJ SOLN
INTRAMUSCULAR | Status: DC | PRN
  Administered 2013-11-11: 1 mg via INTRAVENOUS

## 2013-11-11 MED ORDER — SODIUM CHLORIDE 0.9 % IR SOLN
Status: DC | PRN
  Administered 2013-11-11: 1

## 2013-11-11 SURGICAL SUPPLY — 53 items
ADH SKN CLS APL DERMABOND .7 (GAUZE/BANDAGES/DRESSINGS) ×1
APPLIER CLIP 11 MED OPEN (CLIP) ×2
APR CLP MED 11 20 MLT OPN (CLIP) ×1
BINDER BREAST LRG (GAUZE/BANDAGES/DRESSINGS) IMPLANT
BINDER BREAST MEDIUM (GAUZE/BANDAGES/DRESSINGS) IMPLANT
BINDER BREAST XLRG (GAUZE/BANDAGES/DRESSINGS) ×2 IMPLANT
BINDER BREAST XXLRG (GAUZE/BANDAGES/DRESSINGS) IMPLANT
BIOPATCH RED 1 DISK 7.0 (GAUZE/BANDAGES/DRESSINGS) IMPLANT
BLADE SURG 15 STRL LF DISP TIS (BLADE) ×1 IMPLANT
BLADE SURG 15 STRL SS (BLADE) ×2
CANISTER SUCT 1200ML W/VALVE (MISCELLANEOUS) ×2 IMPLANT
CHLORAPREP W/TINT 26ML (MISCELLANEOUS) ×2 IMPLANT
CLIP APPLIE 11 MED OPEN (CLIP) IMPLANT
CLIP TI WIDE RED SMALL 6 (CLIP) IMPLANT
COVER MAYO STAND STRL (DRAPES) ×2 IMPLANT
COVER TABLE BACK 60X90 (DRAPES) ×2 IMPLANT
DECANTER SPIKE VIAL GLASS SM (MISCELLANEOUS) ×1 IMPLANT
DERMABOND ADVANCED (GAUZE/BANDAGES/DRESSINGS) ×1
DERMABOND ADVANCED .7 DNX12 (GAUZE/BANDAGES/DRESSINGS) ×1 IMPLANT
DEVICE DUBIN W/COMP PLATE 8390 (MISCELLANEOUS) ×2 IMPLANT
DRAIN CHANNEL 19F RND (DRAIN) IMPLANT
DRAPE LAPAROSCOPIC ABDOMINAL (DRAPES) ×2 IMPLANT
DRAPE PED LAPAROTOMY (DRAPES) IMPLANT
DRAPE UTILITY XL STRL (DRAPES) ×2 IMPLANT
ELECT COATED BLADE 2.86 ST (ELECTRODE) ×2 IMPLANT
ELECT REM PT RETURN 9FT ADLT (ELECTROSURGICAL) ×2
ELECTRODE REM PT RTRN 9FT ADLT (ELECTROSURGICAL) ×1 IMPLANT
EVACUATOR SILICONE 100CC (DRAIN) IMPLANT
GLOVE BIO SURGEON STRL SZ 6.5 (GLOVE) ×2 IMPLANT
GLOVE BIOGEL PI IND STRL 7.0 (GLOVE) ×1 IMPLANT
GLOVE BIOGEL PI IND STRL 8 (GLOVE) ×1 IMPLANT
GLOVE BIOGEL PI INDICATOR 7.0 (GLOVE) ×1
GLOVE BIOGEL PI INDICATOR 8 (GLOVE) ×1
GLOVE ECLIPSE 8.0 STRL XLNG CF (GLOVE) ×2 IMPLANT
GOWN STRL REUS W/ TWL LRG LVL3 (GOWN DISPOSABLE) ×2 IMPLANT
GOWN STRL REUS W/TWL LRG LVL3 (GOWN DISPOSABLE) ×4
KIT MARKER MARGIN INK (KITS) ×2 IMPLANT
NDL HYPO 25X1 1.5 SAFETY (NEEDLE) ×1 IMPLANT
NEEDLE HYPO 25X1 1.5 SAFETY (NEEDLE) ×2 IMPLANT
NS IRRIG 1000ML POUR BTL (IV SOLUTION) ×2 IMPLANT
PACK BASIN DAY SURGERY FS (CUSTOM PROCEDURE TRAY) ×2 IMPLANT
PENCIL BUTTON HOLSTER BLD 10FT (ELECTRODE) ×2 IMPLANT
SLEEVE SCD COMPRESS KNEE MED (MISCELLANEOUS) ×2 IMPLANT
SPONGE LAP 4X18 X RAY DECT (DISPOSABLE) ×2 IMPLANT
SUT MON AB 4-0 PC3 18 (SUTURE) ×2 IMPLANT
SUT SILK 2 0 SH (SUTURE) IMPLANT
SUT VIC AB 3-0 SH 27 (SUTURE) ×4
SUT VIC AB 3-0 SH 27X BRD (SUTURE) ×2 IMPLANT
SYR CONTROL 10ML LL (SYRINGE) ×2 IMPLANT
TOWEL OR 17X24 6PK STRL BLUE (TOWEL DISPOSABLE) ×4 IMPLANT
TOWEL OR NON WOVEN STRL DISP B (DISPOSABLE) IMPLANT
TUBE CONNECTING 20X1/4 (TUBING) ×2 IMPLANT
YANKAUER SUCT BULB TIP NO VENT (SUCTIONS) ×2 IMPLANT

## 2013-11-11 NOTE — Interval H&P Note (Signed)
History and Physical Interval Note:  11/11/2013 12:03 PM  Connie [name]  has presented today for surgery, with the diagnosis of left breast dcis  The various methods of treatment have been discussed with the patient and family. After consideration of risks, benefits and other options for treatment, the patient has consented to  Procedure(s): BREAST LUMPECTOMY WITH NEEDLE LOCALIZATION (Left) as a surgical intervention .  The patient's history has been reviewed, patient examined, no change in status, stable for surgery.  I have reviewed the patient's chart and labs.  Questions were answered to the patient's satisfaction.     Jeovani Weisenburger A.

## 2013-11-11 NOTE — Brief Op Note (Signed)
11/11/2013  1:19 PM  PATIENT:  Connie [name]  53 y.o. female  PRE-OPERATIVE DIAGNOSIS:   History of left breast DCIS  and residual calcifications  POST-OPERATIVE DIAGNOSIS:  SAME PROCEDURE:  Procedure(s): BREAST LUMPECTOMY WITH NEEDLE LOCALIZATION (Left)  SURGEON:  Surgeon(s) and Role:    * Nolawi Kanady A. Bryley Chrisman, MD - Primary     AANESTHESIA:   local and general  EBL:  Total I/O In: 1400 [I.V.:1400] Out: -   BLOOD ADMINISTERED:none  DRAINS: none   LOCAL MEDICATIONS USED:  BUPIVICAINE   SPECIMEN:  Source of Specimen:  LEFT BREAST  DISPOSITION OF SPECIMEN:  PATHOLOGY  COUNTS:  YES  TOURNIQUET:  * No tourniquets in log *  DICTATION: .Other Dictation: Dictation Number [id number]  PLAN OF CARE: Discharge to home after PACU  PATIENT DISPOSITION:  PACU - hemodynamically stable.   Delay start of Pharmacological VTE agent (>24hrs) due to surgical blood loss or risk of bleeding: not applicable

## 2013-11-11 NOTE — Anesthesia Preprocedure Evaluation (Addendum)
Anesthesia Evaluation  Patient identified by MRN, date of birth, ID band Patient awake    Reviewed: Allergy & Precautions, H&P , NPO status , Patient's Chart, lab work & pertinent test results, reviewed documented beta blocker date and time   Airway Mallampati: II TM Distance: >3 FB Neck ROM: Full    Dental no notable dental hx. (+) Teeth Intact, Dental Advisory Given   Pulmonary neg pulmonary ROS, former smoker,  breath sounds clear to auscultation  Pulmonary exam normal       Cardiovascular negative cardio ROS  Rhythm:Regular Rate:Normal     Neuro/Psych negative neurological ROS  negative psych ROS   GI/Hepatic negative GI ROS, (+) Hepatitis -, CHx of esophageal varices   Endo/Other  negative endocrine ROS  Renal/GU      Musculoskeletal   Abdominal   Peds  Hematology negative hematology ROS (+)   Anesthesia Other Findings   Reproductive/Obstetrics negative OB ROS                          Anesthesia Physical Anesthesia Plan  ASA: II  Anesthesia Plan: General   Post-op Pain Management:    Induction: Intravenous  Airway Management Planned: LMA  Additional Equipment:   Intra-op Plan:   Post-operative Plan: Extubation in OR  Informed Consent: I have reviewed the patients History and Physical, chart, labs and discussed the procedure including the risks, benefits and alternatives for the proposed anesthesia with the patient or authorized representative who has indicated his/her understanding and acceptance.   Dental advisory given  Plan Discussed with: CRNA and Surgeon  Anesthesia Plan Comments:         Anesthesia Quick Evaluation

## 2013-11-11 NOTE — H&P (Signed)
Connie [name] is an 53 y.o. female.   Chief Complaint: DCIS with residual calcification HPI: Pt underwent left breast lumpectomy times 2 in 12/14 with one area of DCIS with negative margins and the other benign.  She presented for post procedure mammography and had residual calcifications.  Excision of these recommended prior to initiation of radiation therapy.    Past Medical History  Diagnosis Date  . Psoriasis   . Hepatitis C     had the 12 week treatment-now gone  . 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 Wears glasses   . Cancer     breast    Past Surgical History  Procedure Laterality Date  . Caesaran   [id number]    x2  . Upper gi endoscopy    . Tubal ligation  2001    after last c-sec  . Breast lumpectomy with needle localization Left 09/10/2013    Procedure: BREAST LUMPECTOMY WITH NEEDLE LOCALIZATION (two);  Surgeon: Joyice Faster. Illianna Paschal, MD;  Location: [facility];  Service: General;  Laterality: Left;  . Cesarean section      2  . Breast surgery      lumpectomy on left    Family History  Problem Relation Age of Onset  . Heart attack Father 89    Deceased  . Heart disease Father   . Congestive Heart Failure Mother 46    deceased  . Heart disease Mother    Social History:  reports that she quit smoking about 2 months ago. Her smoking use included Cigarettes. She smoked 0.50 packs per day. She does not have any smokeless tobacco history on file. She reports that she does not drink alcohol or use illicit drugs.  Allergies: No Known Allergies  No prescriptions prior to admission    No results found for this or any previous visit (from the past 48 hour(s)). No results found.  Review of Systems  HENT: Negative.   Eyes: Negative.   Respiratory: Negative.   Cardiovascular: Negative.     Height 5\' 6"  (1.676 m), weight 185 lb (83.915 kg). Physical Exam  Constitutional: She appears well-developed.  Respiratory:    Skin: Skin is warm and dry.    Psychiatric: She has [name] normal mood and affect. Her behavior is normal. Judgment normal.     Assessment/Plan Residual calcification left breast with previous excision of DCIS and second benign lesion with negative margins. Will excise residual calcifications today.  The procedure has been discussed with the patient. Alternatives to surgery have been discussed with the patient.  Risks of surgery include bleeding,  Infection,  Seroma formation, death,  and the need for further surgery.   The patient understands and wishes to proceed.  Connie [name]. 11/11/2013, 7:08 AM

## 2013-11-11 NOTE — Anesthesia Postprocedure Evaluation (Signed)
  Anesthesia Post-op Note  Patient: Connie [name]  Procedure(s) Performed: Procedure(s): BREAST LUMPECTOMY WITH NEEDLE LOCALIZATION (Left)  Patient Location: PACU  Anesthesia Type:General  Level of Consciousness: awake and alert   Airway and Oxygen Therapy: Patient Spontanous Breathing  Post-op Pain: mild  Post-op Assessment: Post-op Vital signs reviewed  Post-op Vital Signs: stable  Complications: No apparent anesthesia complications

## 2013-11-11 NOTE — Anesthesia Procedure Notes (Signed)
Procedure Name: LMA Insertion Date/Time: 11/11/2013 12:31 PM Performed by: Baxter Flattery Pre-anesthesia Checklist: Patient identified, Emergency Drugs available, Suction available and Patient being monitored Patient Re-evaluated:Patient Re-evaluated prior to inductionOxygen Delivery Method: Circle System Utilized Preoxygenation: Pre-oxygenation with 100% oxygen Intubation Type: IV induction Ventilation: Mask ventilation without difficulty LMA: LMA inserted LMA Size: 4.0 Number of attempts: 1 Airway Equipment and Method: bite block Placement Confirmation: positive ETCO2 and breath sounds checked- equal and bilateral Tube secured with: Tape Dental Injury: Teeth and Oropharynx as per pre-operative assessment

## 2013-11-11 NOTE — Discharge Instructions (Signed)
°Post Anesthesia Home Care Instructions ° °Activity: °Get plenty of rest for the remainder of the day. A responsible adult should stay with you for 24 hours following the procedure.  °For the next 24 hours, DO NOT: °-Drive a car °-Operate machinery °-Drink alcoholic beverages °-Take any medication unless instructed by your physician °-Make any legal decisions or sign important papers. ° °Meals: °Start with liquid foods such as gelatin or soup. Progress to regular foods as tolerated. Avoid greasy, spicy, heavy foods. If nausea and/or vomiting occur, drink only clear liquids until the nausea and/or vomiting subsides. Call your physician if vomiting continues. ° °Special Instructions/Symptoms: °Your throat may feel dry or sore from the anesthesia or the breathing tube placed in your throat during surgery. If this causes discomfort, gargle with warm salt water. The discomfort should disappear within 24 hours. ° ° ° ° ° ° ° °[facility] °Office Phone Number [phone] ° °BREAST BIOPSY/ PARTIAL MASTECTOMY: POST OP INSTRUCTIONS ° °Always review your discharge instruction sheet given to you by the facility where your surgery was performed. ° °IF YOU HAVE DISABILITY OR FAMILY LEAVE FORMS, YOU MUST BRING THEM TO THE OFFICE FOR PROCESSING.  DO NOT GIVE THEM TO YOUR DOCTOR. ° °1. A prescription for pain medication may be given to you upon discharge.  Take your pain medication as prescribed, if needed.  If narcotic pain medicine is not needed, then you may take acetaminophen (Tylenol) or ibuprofen (Advil) as needed. °2. Take your usually prescribed medications unless otherwise directed °3. If you need a refill on your pain medication, please contact your pharmacy.  They will contact our office to request authorization.  Prescriptions will not be filled after 5pm or on week-ends. °4. You should eat very light the first 24 hours after surgery, such as soup, crackers, pudding, etc.  Resume your normal diet the  day after surgery. °5. Most patients will experience some swelling and bruising in the breast.  Ice packs and a good support bra will help.  Swelling and bruising can take several days to resolve.  °6. It is common to experience some constipation if taking pain medication after surgery.  Increasing fluid intake and taking a stool softener will usually help or prevent this problem from occurring.  A mild laxative (Milk of Magnesia or Miralax) should be taken according to package directions if there are no bowel movements after 48 hours. °7. Unless discharge instructions indicate otherwise, you may remove your bandages 24-48 hours after surgery, and you may shower at that time.  You may have steri-strips (small skin tapes) in place directly over the incision.  These strips should be left on the skin for 7-10 days.  If your surgeon used skin glue on the incision, you may shower in 24 hours.  The glue will flake off over the next 2-3 weeks.  Any sutures or staples will be removed at the office during your follow-up visit. °8. ACTIVITIES:  You may resume regular daily activities (gradually increasing) beginning the next day.  Wearing a good support bra or sports bra minimizes pain and swelling.  You may have sexual intercourse when it is comfortable. °a. You may drive when you no longer are taking prescription pain medication, you can comfortably wear a seatbelt, and you can safely maneuver your car and apply brakes. °b. RETURN TO WORK:  ______________________________________________________________________________________ °9. You should see your doctor in the office for a follow-up appointment approximately two weeks after your surgery.  Your doctor’s nurse will   when she calls you with your pathology report.  Expect your pathology report 2-3 business days after your surgery.  You may call to check if you do not hear from [place] after three days. 10. OTHER INSTRUCTIONS:  _______________________________________________________________________________________________ _____________________________________________________________________________________________________________________________________ _____________________________________________________________________________________________________________________________________ _____________________________________________________________________________________________________________________________________  WHEN TO CALL YOUR DOCTOR: 1. Fever over 101.0 2. Nausea and/or vomiting. 3. Extreme swelling or bruising. 4. Continued bleeding from incision. 5. Increased pain, redness, or drainage from the incision.  The clinic staff is available to answer your questions during regular business hours.  Please dont hesitate to call and ask to speak to one of the nurses for clinical concerns.  If you have a medical emergency, go to the nearest emergency room or call 911.  A surgeon from [facility] is always on call at the hospital.  For further questions, please visit [email or web address]

## 2013-11-11 NOTE — Transfer of Care (Signed)
Immediate Anesthesia Transfer of Care Note  Patient: Connie [name]  Procedure(s) Performed: Procedure(s): BREAST LUMPECTOMY WITH NEEDLE LOCALIZATION (Left)  Patient Location: PACU  Anesthesia Type:General  Level of Consciousness: awake, sedated and responds to stimulation  Airway & Oxygen Therapy: Patient Spontanous Breathing and Patient connected to face mask oxygen  Post-op Assessment: Report given to PACU RN, Post -op Vital signs reviewed and stable and Patient moving all extremities  Post vital signs: Reviewed and stable  Complications: No apparent anesthesia complications

## 2013-11-12 ENCOUNTER — Encounter (HOSPITAL_BASED_OUTPATIENT_CLINIC_OR_DEPARTMENT_OTHER): Payer: Self-pay | Admitting: Surgery

## 2013-11-12 ENCOUNTER — Ambulatory Visit: Payer: BC Managed Care – PPO

## 2013-11-12 NOTE — Op Note (Signed)
NAMETaylynn, Easton Ramsey                  ACCOUNT NO.:  [id number]  MEDICAL RECORD NO.:  [id number]  LOCATION:                                 FACILITY:  PHYSICIAN:  Coron Rossano A. 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  PROCEDURE:  Needle localized left breast re-excision lumpectomy.  SURGEON:  Marcello Moores A. Airik Goodlin, M.D.  ANESTHESIA:  LMA with 0.25% Sensorcaine local with epinephrine.  ESTIMATED BLOOD LOSS:  Minimal.  SPECIMEN:  Wire with left breast tissues and previous lumpectomy cavity to include the anterior cluster of microcalcifications and the radiography specimen was verified.  DRAINS:  None.  INDICATIONS FOR PROCEDURE:  The patient is a 53 year old female who underwent a double lumpectomy in December 2014.  This is for DCIS and it is like was benign.  At the 12 o'clock position, the lumpectomy specimen showed a clear margin around the small focus of DCIS and microcalcifications.  She was being set up for radiation and a postoperative mammogram was obtained prior to beginning in this radiation treatments.  There is a cluster of anterior calcifications felt to be suspicious and excision was recommended prior to beginning radiation therapy to exclude further calcifications and DCIS.  I discussed this with the patient.  She understood while we were doing this and agreed to proceed.  Risks were outlined as well as long-term expectations and possibility of cosmetic deformity, and the need for mastectomy if indeed showed up to be more DCIS with any sort of positive margin.  Risk of bleeding, infection, cosmetic deformity, need  for further operative procedures, seroma, and the need for radiation postoperatively were all discussed with the patient.  She understood and agreed to proceed.  DESCRIPTION OF PROCEDURE:  The patient underwent a wire localization at the [facility] today.  She was then taken back to the operating room after met with her in the holding area, answering questions and marked her left breast as the appropriate size. After being placed supine, the left breast was prepped and draped in sterile fashion and time-out was done.  Incision was made after time out through the old scar and the wire entered just above this.  I brought out the wire at incision and excised majority of the old cavity to include the anterior cluster of microcalcifications and this was oriented with ink Cavity was found to be hemostatic.  It was closed with 3-0 Vicryl and 4- 0 Monocryl.  There was mild sinking of the nipple since we had to go up underneath this to get this anterior margin.  Dermabond applied.  A binder put in place.  All final counts of sponge, needle, and instruments found to be correct.  The patient was extubated and taken to recovery in satisfactory condition. All final counts were found to be correct for the second time.     Oney Folz A.  Shivon Hackel, M.D.     TAC/MEDQ  D:  11/11/2013  T:  11/11/2013  Job:  [id number]

## 2013-11-13 ENCOUNTER — Ambulatory Visit: Payer: BC Managed Care – PPO

## 2013-11-14 ENCOUNTER — Ambulatory Visit: Payer: BC Managed Care – PPO

## 2013-11-14 ENCOUNTER — Telehealth (INDEPENDENT_AMBULATORY_CARE_PROVIDER_SITE_OTHER): Payer: Self-pay

## 2013-11-14 NOTE — Telephone Encounter (Signed)
Message copied by Carlene Coria on Fri Nov 14, 2013  3:42 PM ------      Message from: Connie [name]      Created: Thu Nov 13, 2013 12:47 PM       No more DCIS. ------

## 2013-11-14 NOTE — Telephone Encounter (Signed)
LM> path shows no more DCIS

## 2013-11-17 ENCOUNTER — Ambulatory Visit: Payer: BC Managed Care – PPO

## 2013-11-18 ENCOUNTER — Ambulatory Visit: Payer: BC Managed Care – PPO

## 2013-11-19 ENCOUNTER — Ambulatory Visit: Payer: BC Managed Care – PPO

## 2013-11-20 ENCOUNTER — Ambulatory Visit: Payer: BC Managed Care – PPO

## 2013-11-21 ENCOUNTER — Ambulatory Visit: Payer: BC Managed Care – PPO

## 2013-11-24 ENCOUNTER — Ambulatory Visit (HOSPITAL_BASED_OUTPATIENT_CLINIC_OR_DEPARTMENT_OTHER): Payer: BC Managed Care – PPO | Admitting: Oncology

## 2013-11-24 ENCOUNTER — Ambulatory Visit: Payer: BC Managed Care – PPO

## 2013-11-24 ENCOUNTER — Telehealth: Payer: Self-pay | Admitting: Oncology

## 2013-11-24 VITALS — BP 115/76 | HR 65 | Temp 98.1°F | Resp 20 | Ht 66.0 in | Wt 186.2 lb

## 2013-11-24 DIAGNOSIS — C50412 Malignant neoplasm of upper-outer quadrant of left female breast: Secondary | ICD-10-CM

## 2013-11-24 DIAGNOSIS — Z9889 Other specified postprocedural states: Secondary | ICD-10-CM

## 2013-11-24 DIAGNOSIS — L408 Other psoriasis: Secondary | ICD-10-CM

## 2013-11-24 DIAGNOSIS — Z170 Estrogen receptor positive status [ER+]: Secondary | ICD-10-CM

## 2013-11-24 DIAGNOSIS — B1920 Unspecified viral hepatitis C without hepatic coma: Secondary | ICD-10-CM

## 2013-11-24 DIAGNOSIS — Z780 Asymptomatic menopausal state: Secondary | ICD-10-CM

## 2013-11-24 DIAGNOSIS — D0590 Unspecified type of carcinoma in situ of unspecified breast: Secondary | ICD-10-CM

## 2013-11-24 NOTE — Telephone Encounter (Signed)
, °

## 2013-11-24 NOTE — Progress Notes (Signed)
ID: Loma Messing OB: 07/10/1961  MR#: [id number]  BUY#:[id number]  PCP: Daphene Jaeger, PA-C GYN:   SU: Erroll Luna OTHER MD: Melody Comas  Armandina Gemma, Silvestre Mesi  CHIEF COMPLAINT: Roland Rack him about to start by radiation".  HISTORY OF PRESENT ILLNESS: The patient had routine screening bilateral mammography at the breast center 07/15/2013 showing calcifications in the left breast which call for further evaluation. On 08/12/2013 limited diagnostic mammography of the left breast confirmed a group of calcifications in the upper outer quadrant spanning a distance of approximately 9 mm.  Biopsy of this area on 08/18/2013 showed ([id number]) ductal carcinoma in situ, intermediate grade, estrogen and progesterone receptor positive by verbal report. A second biopsy that proved to be a fibroadenoma.  Breast MRI 08/26/2013 showed an area of clumped nodular enhancement associated with clip artifact in the left breast measuring 1.7 cm. A second area inferior and medial to the first also showed irregular enhancement. There were no abnormal appearing lymph nodes. There was mild inhomogeneous marrow signal in the sternum without focal lesion found. Biopsy of the second area of concern in the left breast is planned.  The patient's subsequent history is as detailed below  INTERVAL HISTORY: Danahi returns today for followup of her breast cancer. She was supposed to be finishing her radiation by now, but a postop left mammogram on 10/21/2013 showed a 5 mm group of residual calcifications in the upper left breast. 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 The patient has recovered well from her surgery and is now ready to start her radiation treatments  REVIEW OF SYSTEMS: Naviyah is having some body image issues but "am adapting". She finds that she is less tolerant and can be a little irritable. She  admits to anxiety and depression. She is very concerned about her psoriasis, and she has been started on Humira, which the patient tells me is already working quite well. A detailed review of systems today was otherwise noncontributory  PAST MEDICAL HISTORY: Past Medical History  Diagnosis Date  . Psoriasis   . Hepatitis C     had the 12 week treatment-now gone  . 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 Wears glasses   . Cancer     breast  . PONV (postoperative nausea and vomiting)    psoriasis  PAST SURGICAL HISTORY: Past Surgical History  Procedure Laterality Date  . Caesaran   [id number]    x2  . Upper gi endoscopy    . Tubal ligation  2001    after last c-sec  . Breast lumpectomy with needle localization Left 09/10/2013    Procedure: BREAST LUMPECTOMY WITH NEEDLE LOCALIZATION (two);  Surgeon: Joyice Faster. Cornett, MD;  Location: [facility];  Service: General;  Laterality: Left;  . Cesarean section      2  . Breast surgery      lumpectomy on left  . Breast lumpectomy with needle localization Left 11/11/2013    Procedure: BREAST LUMPECTOMY WITH NEEDLE LOCALIZATION;  Surgeon: Marcello Moores A. Cornett, MD;  Location: [facility];  Service: General;  Laterality: Left;    FAMILY HISTORY Family History  Problem Relation Age of Onset  . Heart attack Father 68    Deceased  . Heart disease Father   . Congestive Heart Failure Mother 63    deceased  . Heart disease Mother    The patient's  father died from a myocardial infarction at the age of 24. The patient's mother died with congestive heart failure secondary to myocarditis at the age of 60. The patient had one brother, and one sister. There is no history of breast or ovarian cancer in the family to her knowledge.  GYNECOLOGIC HISTORY:  Menarche age 53, first live birth age 53, the patient is GX P2. Last menstrual period was approximately 2008. She did not take hormone replacement.  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 Son Aida Puffer December: Were works as a Optometrist. Son Judithe Keetch 13 lives with the patient and her husband. There are no grandchildren. The patient attends [employer].    ADVANCED DIRECTIVES: Not in place   HEALTH MAINTENANCE: History  Substance Use Topics  . Smoking status: Former Smoker -- 0.50 packs/day    Types: Cigarettes    Quit date: 08/30/2013  . Smokeless tobacco: Not on file  . Alcohol Use: No     Colonoscopy: 10/09/2013 showed only a hyperplastic polyp  PAP:  Bone density:  Lipid panel:  No Known Allergies  Current Outpatient Prescriptions  Medication Sig Dispense Refill  . BIOTIN PO Take 1 tablet by mouth daily.      Marland Kitchen oxyCODONE-acetaminophen (ROXICET) 5-325 MG per tablet Take 1 tablet by mouth every 4 (four) hours as needed.  30 tablet  0  . Prenatal Vit-Fe Fumarate-FA (PRENATAL MULTIVITAMIN) TABS tablet Take 1 tablet by mouth daily.      . propranolol (INDERAL) 60 MG tablet Take 60 mg by mouth daily. Takes for esoph varies from having hep c-treated now      . triamcinolone cream (KENALOG) 0.1 % Apply 1 application topically 2 (two) times daily. Mixed with Cetaphil       No current facility-administered medications for this visit.    OBJECTIVE: Middle-aged white woman in no acute distress  Filed Vitals:   11/24/13 1440  BP: 115/76  Pulse: 65  Temp: 98.1 F (36.7 C)  Resp: 20     Body mass index is 30.07 kg/(m^2).    ECOG FS:1 - Symptomatic but completely ambulatory  Ocular: Sclerae unicteric, pupils round and equal  Ear-nose-throat: Oropharynx clear and moist  Lymphatic: No cervical or supraclavicular adenopathy Lungs no rales or rhonchi, good excursion bilaterally Heart regular rate and rhythm, no murmur appreciated Abd soft, nontender, positive bowel sounds MSK no focal spinal tenderness, no joint edema Neuro:  non-focal, well-oriented, appropriate affect Breasts: The right breast is unremarkable. The left breast is status post 2 resections. The incisions are healing very nicely and the cosmetic result is good. The left breast is slightly smaller than the right, but hangs well and the nipple is forward.. The left breast is unremarkable.   LAB RESULTS:  CMP     Component Value Date/Time   NA 143 08/27/2013 0814   NA 141 11/22/2011 1106   K 3.9 08/27/2013 0814   K 3.8 11/22/2011 1106   CL 106 11/22/2011 1106   CO2 24 08/27/2013 0814   CO2 26 11/22/2011 1106   GLUCOSE 92 08/27/2013 0814   GLUCOSE 85 11/22/2011 1106   BUN 17.4 08/27/2013 0814   BUN 10 11/22/2011 1106   CREATININE 0.8 08/27/2013 0814   CREATININE 0.61 11/22/2011 1106   CALCIUM 9.7 08/27/2013 0814   CALCIUM 9.3 11/22/2011 1106   PROT 7.1 08/27/2013 0814  PROT 7.2 11/22/2011 1106   ALBUMIN 3.6 08/27/2013 0814   ALBUMIN 3.9 11/22/2011 1106   AST 35* 08/27/2013 0814   AST 346* 11/22/2011 1106   ALT 42 08/27/2013 0814   ALT 422* 11/22/2011 1106   ALKPHOS 89 08/27/2013 0814   ALKPHOS 154* 11/22/2011 1106   BILITOT 0.68 08/27/2013 0814   BILITOT 1.5* 11/22/2011 1106    I No results found for this basename: SPEP,  UPEP,   kappa and lambda light chains    Lab Results  Component Value Date   WBC 6.3 08/27/2013   NEUTROABS 3.0 08/27/2013   HGB 14.7 08/27/2013   HCT 43.2 08/27/2013   MCV 94.9 08/27/2013   PLT 139* 08/27/2013      Chemistry      Component Value Date/Time   NA 143 08/27/2013 0814   NA 141 11/22/2011 1106   K 3.9 08/27/2013 0814   K 3.8 11/22/2011 1106   CL 106 11/22/2011 1106   CO2 24 08/27/2013 0814   CO2 26 11/22/2011 1106   BUN 17.4 08/27/2013 0814   BUN 10 11/22/2011 1106   CREATININE 0.8 08/27/2013 0814   CREATININE 0.61 11/22/2011 1106      Component Value Date/Time   CALCIUM 9.7 08/27/2013 0814   CALCIUM 9.3 11/22/2011 1106   ALKPHOS 89 08/27/2013 0814   ALKPHOS 154* 11/22/2011 1106   AST 35* 08/27/2013 0814   AST 346*  11/22/2011 1106   ALT 42 08/27/2013 0814   ALT 422* 11/22/2011 1106   BILITOT 0.68 08/27/2013 0814   BILITOT 1.5* 11/22/2011 1106       No results found for this basename: LABCA2    No components found with this basename: AIBLO536    No results found for this basename: INR,  in the last 168 hours  Urinalysis    Component Value Date/Time   BILIRUBINUR small 11/22/2011 1106   UROBILINOGEN 2.0 11/22/2011 1106   NITRITE neg 11/22/2011 1106   LEUKOCYTESUR Negative 11/22/2011 1106    STUDIES: Mm Lt Plc Breast Loc Dev   1st Lesion  Inc Mammo Guide  11/11/2013   CLINICAL DATA:  Status post left lumpectomy. Pre radiation mammogram shows left breast calcifications. The patient presents for needle localization prior to excision.  EXAM: NEEDLE LOCALIZATION OF THE LEFT BREAST WITH MAMMO GUIDANCE  COMPARISON:  Previous exams.  FINDINGS: Patient presents for needle localization prior to excision. I met with the patient and we discussed the procedure of needle localization including benefits and alternatives. We discussed the high likelihood of a successful procedure. We discussed the risks of the procedure, including infection, bleeding, tissue injury, and further surgery. Informed, written consent was given. The usual time-out protocol was performed immediately prior to the procedure.  Using mammographic guidance, sterile technique, 2% lidocaine and a modified Kopans needle, calcifications in the upper central portion localized using cephalad approach. The films were marked for Dr. Luisa Hart.  Specimen radiograph confirms calcifications present in the tissue sample. The specimen was marked for pathology.  IMPRESSION: Needle localization of the left breast. No apparent complications.   Electronically Signed   By: Rosalie Gums M.D.   On: 11/11/2013 13:10    ASSESSMENT: 53 y.o. [employer] woman status post left breast biopsy 08/18/2013 for a ductal carcinoma in situ, grade 2, estrogen and progesterone receptor  positive.  (1) status post MRI guided biopsy 09/02/2013 of a second area in the left breast, showing an additional 2 mm of ductal carcinoma in situ, with  margins negative but close  (2) postoperative mammography to 12/05/2013 showed a 5 mm group of residual calcifications in the left upper breast, which were removed 11/11/2013, showing only lobular neoplasia. Margins were clearly negative.  (3) adjuvant radiation to follow later this month  (4) tamoxifen to start June 1, after the patient has recovered from radiation.  (5) psoriasis, started on adalimumab/ Humira February 2015  PLAN: Even though Keshara has not yet started radiation, we spent well over 45 minutes today discussing the possible toxicities, side effects, and complications of anti-estrogens as well as the possible benefits. She understands these medications will cut in half her risk of this breast cancer recurring, and more importantly I think also reduce in half her risk of developing a new breast cancer in either breast, which can be as high as 1% per year.  After much discussion she decided that she would prefer to try tamoxifen and I went ahead and wrote her a prescription. I suggested she not started until June 1. Hopefully by that time her symptoms from radiation should have abated.  She will see me again late September. If she is tolerating tamoxifen well the plan will be for her to see [place] on a yearly basis until she completes 5 years of followup. Otherwise we will give anastrozole at.  Laiken has a good understanding of this plan. She is in agreement with that. She knows a goal of treatment is cure and prevention. She will call for any problems that may develop before her next visit here.   Chauncey Cruel, MD   11/24/2013 2:59 PM

## 2013-11-25 ENCOUNTER — Ambulatory Visit: Payer: BC Managed Care – PPO

## 2013-11-26 ENCOUNTER — Ambulatory Visit: Payer: BC Managed Care – PPO

## 2013-11-27 ENCOUNTER — Ambulatory Visit: Payer: BC Managed Care – PPO

## 2013-11-28 ENCOUNTER — Ambulatory Visit: Payer: BC Managed Care – PPO

## 2013-11-28 ENCOUNTER — Encounter (INDEPENDENT_AMBULATORY_CARE_PROVIDER_SITE_OTHER): Payer: Self-pay | Admitting: Surgery

## 2013-11-28 ENCOUNTER — Ambulatory Visit (INDEPENDENT_AMBULATORY_CARE_PROVIDER_SITE_OTHER): Payer: BC Managed Care – PPO | Admitting: Surgery

## 2013-11-28 VITALS — BP 138/80 | HR 80 | Temp 98.1°F | Resp 16 | Ht 66.0 in | Wt 188.2 lb

## 2013-11-28 DIAGNOSIS — Z9889 Other specified postprocedural states: Secondary | ICD-10-CM

## 2013-11-28 NOTE — Patient Instructions (Signed)
Return 3 months

## 2013-11-28 NOTE — Progress Notes (Signed)
Patient returns after left breast needle localized lumpectomy. She underwent previous left breast lumpectomy for DCIS. She went proposed procedure mammography which showed calcifications near the lumpectomy bed and a reexcision recommended. Pathology showed atypical lobular hyperplasia calcifications without residual DCIS. She is doing well.  Exam: Incisions left breast clean dry and intact. No significant seroma. No significant cosmetic problem. No erythema.  Breast, lumpectomy, Left - LOBULAR NEOPLASIA (ATYPICAL LOBULAR HYPERPLASIA AND IN SITU CARCINOMA), SEE COMMENT. - MICROCALCIFICATIONS IDENTIFIED. - SURGICAL MARGIN, NEGATIVE FOR MALIGNANCY.  Impression: Status post reexcision left breast lumpectomy for calcifications with history of DCIS clear margins  Plan: Return to see radiation oncology. Will benefit from anti-estrogen therapy given atypical lobular hyperplasia. Return to clinic 3 months.

## 2013-12-01 ENCOUNTER — Ambulatory Visit: Payer: BC Managed Care – PPO

## 2013-12-02 ENCOUNTER — Ambulatory Visit: Payer: BC Managed Care – PPO

## 2013-12-03 ENCOUNTER — Ambulatory Visit: Payer: BC Managed Care – PPO

## 2013-12-04 ENCOUNTER — Ambulatory Visit
Admission: RE | Admit: 2013-12-04 | Payer: BC Managed Care – PPO | Source: Ambulatory Visit | Admitting: Radiation Oncology

## 2013-12-04 ENCOUNTER — Encounter: Payer: Self-pay | Admitting: Radiation Oncology

## 2013-12-04 ENCOUNTER — Ambulatory Visit: Payer: BC Managed Care – PPO

## 2013-12-05 ENCOUNTER — Ambulatory Visit: Payer: BC Managed Care – PPO

## 2013-12-05 NOTE — Progress Notes (Signed)
Patient sent message via my chart that appointment for 12/10/13 needs to be rescheduled.Kristi RT will call and reschedule.Patient not able to do 12/11/13 as well.

## 2013-12-09 ENCOUNTER — Telehealth: Payer: Self-pay | Admitting: Internal Medicine

## 2013-12-09 NOTE — Telephone Encounter (Deleted)
Error

## 2013-12-10 ENCOUNTER — Other Ambulatory Visit: Payer: Self-pay | Admitting: Radiation Oncology

## 2013-12-16 ENCOUNTER — Ambulatory Visit
Admission: RE | Admit: 2013-12-16 | Discharge: 2013-12-16 | Disposition: A | Discharge status: Home / Self Care | Payer: BC Managed Care – PPO | Source: Ambulatory Visit | Attending: Radiation Oncology | Admitting: Radiation Oncology

## 2013-12-16 DIAGNOSIS — C50412 Malignant neoplasm of upper-outer quadrant of left female breast: Secondary | ICD-10-CM

## 2013-12-16 NOTE — Progress Notes (Signed)
Radiation Oncology         [phone] ________________________________  Name: Connie [name]      MRN: [id number]          Date: 12/16/2013              DOB: 11-17-1960  Optical Surface Tracking Plan:  Since intensity modulated radiotherapy (IMRT) and 3D conformal radiation treatment methods are predicated on accurate and precise positioning for treatment, intrafraction motion monitoring is medically necessary to ensure accurate and safe treatment delivery.  The ability to quantify intrafraction motion without excessive ionizing radiation dose can only be performed with optical surface tracking. Accordingly, surface imaging offers the opportunity to obtain 3D measurements of patient position throughout IMRT and 3D treatments without excessive radiation exposure.  I am ordering optical surface tracking for this patient's upcoming course of radiotherapy. ________________________________ Signature   Reference:   Ursula Alert, J, et al. Surface imaging-based analysis of intrafraction motion for breast radiotherapy patients.Journal of [place], n. 6, nov. 2014. ISSN [id number].   Available at: <http://www.jacmp.org/index.php/jacmp/article/view/4957>.

## 2013-12-16 NOTE — Progress Notes (Signed)
Name: Connie [name]   MRN: [id number]  Date:  12/16/2013  DOB: May 04, 1961  Status:outpatient   DIAGNOSIS: Left Breast cancer.  CONSENT VERIFIED: yes SET UP: Patient is setup supine  IMMOBILIZATION:  The following immobilization was used:Custom Moldable Pillow, breast board.  NARRATIVE: [name] was brought to the [facility] today for resimulation. She had her re-excision and it was negative for cancer [facility] was seen).  Identity was confirmed.  All relevant records and images related to the planned course of therapy were reviewed.  Then, the patient was positioned in a stable reproducible clinical set-up for radiation therapy.  Wires were placed to delineate the clinical extent of breast tissue. A wire was placed on the scar as well.  CT images were obtained.  An isocenter was placed. Skin markings were placed.  The position of the heart was then analyzed.  Due to the proximity of the heart to the chest wall, I felt she would benefit from deep inspiration breath hold for cardiac sparing.  She was then coached and rescanned in the breath hold position.  Acceptable cardiac sparing was achieved. The CT images were loaded into the planning software where the target and avoidance structures were contoured.  The radiation prescription was entered and confirmed. The patient was discharged in stable condition and tolerated simulation well.    TREATMENT PLANNING NOTE/3D Simulation Note Treatment planning then occurred. I have requested : MLC's, isodose plan, basic dose calculation  3D simulation was performed.  I personally designed and supervised the construction of 3 medically necessary complex treatment devices in the form of MLCs which will be used for beam modification and to protect critical structures including the heart and lung as well as the immobilization device which is necessary for reproducible set up.  I have requested a dose volume histogram of the heart, lung and tumor cavity.     RESPIRATORY MOTION MANAGEMENT SIMULATION - Deep Inspiration Breath Hold  NARRATIVE:  In order to account for effect of respiratory motion on target structures and other organs in the planning and delivery of radiotherapy, this patient underwent respiratory motion management simulation.  To accomplish this, when the patient was brought to the CT simulation planning suite, a bellows was placed on the her abdomen.  Wave forms of the patient's breathing were obtained. Coaching was performed and practice sessions initiated to monitor her ability to obtain and maintain deep inspiration breath hold.  The CT images were loaded into the planning software and fused with her free breathing images by physics.  Acceptable cardiac sparing was achieved through the use of deep inspiration breath hold.  Planning will be performed on her breath hold scan

## 2013-12-23 ENCOUNTER — Ambulatory Visit
Admission: RE | Admit: 2013-12-23 | Discharge: 2013-12-23 | Disposition: A | Discharge status: Home / Self Care | Payer: BC Managed Care – PPO | Source: Ambulatory Visit | Attending: Radiation Oncology | Admitting: Radiation Oncology

## 2013-12-23 DIAGNOSIS — C50412 Malignant neoplasm of upper-outer quadrant of left female breast: Secondary | ICD-10-CM

## 2013-12-23 NOTE — Progress Notes (Signed)
  Radiation Oncology         [phone] ________________________________  Name: Connie [name] MRN: [id number]  Date: 12/23/2013  DOB: 1961-07-22  Simulation Verification Note  Status: outpatient  NARRATIVE: The patient was brought to the treatment unit and placed in the planned treatment position. The clinical setup was verified. Then port films were obtained and uploaded to the radiation oncology medical record software.  The treatment beams were carefully compared against the planned radiation fields. The position location and shape of the radiation fields was reviewed. The targeted volume of tissue appears appropriately covered by the radiation beams. Organs at risk appear to be excluded as planned.  Based on my personal review, I approved the simulation verification. The patient's treatment will proceed as planned.  ------------------------------------------------  Thea Silversmith, MD

## 2013-12-24 ENCOUNTER — Ambulatory Visit
Admission: RE | Admit: 2013-12-24 | Discharge: 2013-12-24 | Disposition: A | Discharge status: Home / Self Care | Payer: BC Managed Care – PPO | Source: Ambulatory Visit | Attending: Radiation Oncology | Admitting: Radiation Oncology

## 2013-12-24 ENCOUNTER — Ambulatory Visit: Payer: BC Managed Care – PPO

## 2013-12-25 ENCOUNTER — Ambulatory Visit: Payer: BC Managed Care – PPO

## 2013-12-26 ENCOUNTER — Ambulatory Visit: Payer: BC Managed Care – PPO

## 2013-12-29 ENCOUNTER — Ambulatory Visit
Admission: RE | Admit: 2013-12-29 | Discharge: 2013-12-29 | Disposition: A | Discharge status: Home / Self Care | Payer: BC Managed Care – PPO | Source: Ambulatory Visit | Attending: Radiation Oncology | Admitting: Radiation Oncology

## 2013-12-29 ENCOUNTER — Ambulatory Visit: Payer: BC Managed Care – PPO

## 2013-12-29 DIAGNOSIS — Z9889 Other specified postprocedural states: Secondary | ICD-10-CM

## 2013-12-29 MED ORDER — ALRA NON-METALLIC DEODORANT (RAD-ONC)
1.0000 "application " | Freq: Once | TOPICAL | Status: AC
  Administered 2013-12-29: 1 via TOPICAL

## 2013-12-29 MED ORDER — RADIAPLEXRX EX GEL
Freq: Once | CUTANEOUS | Status: AC
Start: 2013-12-29 — End: 2013-12-29
  Administered 2013-12-29: 19:00:00 via TOPICAL

## 2013-12-29 NOTE — Progress Notes (Signed)
Education today regarding management of side-effects related radiation to the breast inclusive of skin care, fatigue and pain.  Given Radiaplex Gel to apply at least BID,  treatment and at bedtime and given Alra deodorant.  Advised to use her husbands' Copy instead of a straight razor to prevent nicking of the lower portion of her underarm which is part of her treatment field.  She stated compliance.  Teach back method.  Given the Radiation therapy and You booklet with the appropriate pages marked.

## 2013-12-30 ENCOUNTER — Ambulatory Visit
Admission: RE | Admit: 2013-12-30 | Discharge: 2013-12-30 | Disposition: A | Discharge status: Home / Self Care | Payer: BC Managed Care – PPO | Source: Ambulatory Visit | Attending: Radiation Oncology | Admitting: Radiation Oncology

## 2013-12-30 ENCOUNTER — Ambulatory Visit: Payer: BC Managed Care – PPO

## 2013-12-30 DIAGNOSIS — C50412 Malignant neoplasm of upper-outer quadrant of left female breast: Secondary | ICD-10-CM

## 2013-12-30 NOTE — Progress Notes (Signed)
Weekly Management Note Current Dose:  7.2 Gy  Projected Dose: 61 Gy   Narrative:  The patient presents for routine under treatment assessment.  CBCT/MVCT images/Port film x-rays were reviewed.  The chart was checked. Doing well. No complaints. Using radiaplex.  Physical Findings: No skin changes.  Impression:  The patient is tolerating radiation.  Plan:  Continue treatment as planned. Continue radiaplex.

## 2013-12-30 NOTE — Progress Notes (Signed)
Patent had post sim education last night by Patric Dykes, Rn, no vitals taken, MD in with patient, not assessed by nurse 4:00 PM

## 2013-12-31 ENCOUNTER — Ambulatory Visit
Admission: RE | Admit: 2013-12-31 | Discharge: 2013-12-31 | Disposition: A | Discharge status: Home / Self Care | Payer: BC Managed Care – PPO | Source: Ambulatory Visit | Attending: Radiation Oncology | Admitting: Radiation Oncology

## 2013-12-31 ENCOUNTER — Ambulatory Visit: Payer: BC Managed Care – PPO

## 2014-01-01 ENCOUNTER — Ambulatory Visit
Admission: RE | Admit: 2014-01-01 | Discharge: 2014-01-01 | Disposition: A | Discharge status: Home / Self Care | Payer: BC Managed Care – PPO | Source: Ambulatory Visit | Attending: Radiation Oncology | Admitting: Radiation Oncology

## 2014-01-01 ENCOUNTER — Ambulatory Visit: Payer: BC Managed Care – PPO

## 2014-01-02 ENCOUNTER — Ambulatory Visit: Payer: BC Managed Care – PPO

## 2014-01-05 ENCOUNTER — Ambulatory Visit: Payer: BC Managed Care – PPO

## 2014-01-05 ENCOUNTER — Ambulatory Visit
Admission: RE | Admit: 2014-01-05 | Discharge: 2014-01-05 | Disposition: A | Discharge status: Home / Self Care | Payer: BC Managed Care – PPO | Source: Ambulatory Visit | Attending: Radiation Oncology | Admitting: Radiation Oncology

## 2014-01-06 ENCOUNTER — Ambulatory Visit
Admission: RE | Admit: 2014-01-06 | Discharge: 2014-01-06 | Disposition: A | Discharge status: Home / Self Care | Payer: BC Managed Care – PPO | Source: Ambulatory Visit | Attending: Radiation Oncology | Admitting: Radiation Oncology

## 2014-01-06 ENCOUNTER — Ambulatory Visit: Payer: BC Managed Care – PPO

## 2014-01-06 VITALS — BP 111/73 | HR 76 | Temp 98.6°F | Wt 183.2 lb

## 2014-01-06 DIAGNOSIS — C50412 Malignant neoplasm of upper-outer quadrant of left female breast: Secondary | ICD-10-CM

## 2014-01-06 NOTE — Progress Notes (Signed)
Weekly assessment of radiation to left breast.Completed 8 of 25 treatments.No skin changes.Denies pain.Getting over gastrointestinal virus.

## 2014-01-06 NOTE — Progress Notes (Signed)
Weekly Management Note Current Dose: 14.4  Gy  Projected Dose: 61 Gy   Narrative:  The patient presents for routine under treatment assessment.  CBCT/MVCT images/Port film x-rays were reviewed.  The chart was checked. Doing well. No complaints except some GI symptoms which are getting better.   Physical Findings: Weight: 183 lb 3.2 oz (83.099 kg). Unchanged  Impression:  The patient is tolerating radiation.  Plan:  Continue treatment as planned. Continue RT.

## 2014-01-07 ENCOUNTER — Ambulatory Visit: Payer: BC Managed Care – PPO

## 2014-01-07 ENCOUNTER — Ambulatory Visit
Admission: RE | Admit: 2014-01-07 | Discharge: 2014-01-07 | Disposition: A | Discharge status: Home / Self Care | Payer: BC Managed Care – PPO | Source: Ambulatory Visit | Attending: Radiation Oncology | Admitting: Radiation Oncology

## 2014-01-08 ENCOUNTER — Ambulatory Visit
Admission: RE | Admit: 2014-01-08 | Discharge: 2014-01-08 | Disposition: A | Discharge status: Home / Self Care | Payer: BC Managed Care – PPO | Source: Ambulatory Visit | Attending: Radiation Oncology | Admitting: Radiation Oncology

## 2014-01-08 ENCOUNTER — Ambulatory Visit: Payer: BC Managed Care – PPO

## 2014-01-09 ENCOUNTER — Ambulatory Visit: Payer: BC Managed Care – PPO

## 2014-01-09 ENCOUNTER — Ambulatory Visit
Admission: RE | Admit: 2014-01-09 | Discharge: 2014-01-09 | Disposition: A | Discharge status: Home / Self Care | Payer: BC Managed Care – PPO | Source: Ambulatory Visit | Attending: Radiation Oncology | Admitting: Radiation Oncology

## 2014-01-12 ENCOUNTER — Ambulatory Visit: Payer: BC Managed Care – PPO

## 2014-01-12 ENCOUNTER — Ambulatory Visit
Admission: RE | Admit: 2014-01-12 | Discharge: 2014-01-12 | Disposition: A | Discharge status: Home / Self Care | Payer: BC Managed Care – PPO | Source: Ambulatory Visit | Attending: Radiation Oncology | Admitting: Radiation Oncology

## 2014-01-12 DIAGNOSIS — C50419 Malignant neoplasm of upper-outer quadrant of unspecified female breast: Secondary | ICD-10-CM | POA: Insufficient documentation

## 2014-01-13 ENCOUNTER — Encounter: Payer: Self-pay | Admitting: Radiation Oncology

## 2014-01-13 ENCOUNTER — Ambulatory Visit
Admission: RE | Admit: 2014-01-13 | Discharge: 2014-01-13 | Disposition: A | Discharge status: Home / Self Care | Payer: BC Managed Care – PPO | Source: Ambulatory Visit | Attending: Radiation Oncology | Admitting: Radiation Oncology

## 2014-01-13 ENCOUNTER — Ambulatory Visit: Payer: BC Managed Care – PPO

## 2014-01-13 VITALS — BP 134/70 | HR 93 | Resp 16 | Wt 183.4 lb

## 2014-01-13 DIAGNOSIS — C50412 Malignant neoplasm of upper-outer quadrant of left female breast: Secondary | ICD-10-CM

## 2014-01-13 NOTE — Progress Notes (Signed)
Patient reports mild pink discoloration within treatment area without desquamation. Report using radiaplex bid as directed. Reports mild fatigue related to a "busy life." No complaints at this time.

## 2014-01-13 NOTE — Progress Notes (Signed)
Weekly Management Note Current Dose:23.4   Gy  Projected Dose: 61 Gy   Narrative:  The patient presents for routine under treatment assessment.  CBCT/MVCT images/Port film x-rays were reviewed.  The chart was checked. Doing well. No complaints. Tired and has some job stress.  Physical Findings: Weight: 183 lb 6.4 oz (83.19 kg). Unchanged  Impression:  The patient is tolerating radiation.  Plan:  Continue treatment as planned.Continue radiaplex.

## 2014-01-14 ENCOUNTER — Ambulatory Visit: Payer: BC Managed Care – PPO

## 2014-01-14 ENCOUNTER — Ambulatory Visit
Admission: RE | Admit: 2014-01-14 | Discharge: 2014-01-14 | Disposition: A | Discharge status: Home / Self Care | Payer: BC Managed Care – PPO | Source: Ambulatory Visit | Attending: Radiation Oncology | Admitting: Radiation Oncology

## 2014-01-15 ENCOUNTER — Ambulatory Visit
Admission: RE | Admit: 2014-01-15 | Discharge: 2014-01-15 | Disposition: A | Discharge status: Home / Self Care | Payer: BC Managed Care – PPO | Source: Ambulatory Visit | Attending: Radiation Oncology | Admitting: Radiation Oncology

## 2014-01-15 ENCOUNTER — Ambulatory Visit: Payer: BC Managed Care – PPO

## 2014-01-16 ENCOUNTER — Ambulatory Visit
Admission: RE | Admit: 2014-01-16 | Discharge: 2014-01-16 | Disposition: A | Discharge status: Home / Self Care | Payer: BC Managed Care – PPO | Source: Ambulatory Visit | Attending: Radiation Oncology | Admitting: Radiation Oncology

## 2014-01-16 ENCOUNTER — Ambulatory Visit: Payer: BC Managed Care – PPO

## 2014-01-16 NOTE — Telephone Encounter (Signed)
Please close encounter, Thanks! SR

## 2014-01-19 ENCOUNTER — Ambulatory Visit
Admission: RE | Admit: 2014-01-19 | Discharge: 2014-01-19 | Disposition: A | Discharge status: Home / Self Care | Payer: BC Managed Care – PPO | Source: Ambulatory Visit | Attending: Radiation Oncology | Admitting: Radiation Oncology

## 2014-01-19 ENCOUNTER — Ambulatory Visit: Payer: BC Managed Care – PPO

## 2014-01-20 ENCOUNTER — Ambulatory Visit
Admission: RE | Admit: 2014-01-20 | Discharge: 2014-01-20 | Disposition: A | Discharge status: Home / Self Care | Payer: BC Managed Care – PPO | Source: Ambulatory Visit | Attending: Radiation Oncology | Admitting: Radiation Oncology

## 2014-01-20 ENCOUNTER — Ambulatory Visit
Admission: RE | Admit: 2014-01-20 | Payer: BC Managed Care – PPO | Source: Ambulatory Visit | Admitting: Radiation Oncology

## 2014-01-20 ENCOUNTER — Ambulatory Visit: Payer: BC Managed Care – PPO

## 2014-01-20 VITALS — BP 103/67 | HR 81 | Temp 98.8°F | Wt 183.0 lb

## 2014-01-20 DIAGNOSIS — C50412 Malignant neoplasm of upper-outer quadrant of left female breast: Secondary | ICD-10-CM

## 2014-01-20 MED ORDER — BIAFINE EX EMUL
CUTANEOUS | Status: DC | PRN
  Administered 2014-01-20: 17:00:00 via TOPICAL

## 2014-01-20 NOTE — Progress Notes (Signed)
Weekly Management Note Current Dose:  32.4 Gy  Projected Dose: 61 Gy   Narrative:  The patient presents for routine under treatment assessment.  CBCT/MVCT images/Port film x-rays were reviewed.  The chart was checked. Irritation especially when she wears a bra. Radiaplex not helping.l   Physical Findings: Weight: 183 lb (83.008 kg). Dermatitis medially and under breast.  Impression:  The patient is tolerating radiation.  Plan:  Continue treatment as planned. Switch to biafene. Ok to add cortisone.

## 2014-01-20 NOTE — Progress Notes (Signed)
Weekly assessment of radiation to left breast.Completed 18 of 25 treatments.Moderate pain of radiation induced rash.Will give biafine to use instead of radiaplex.Moderate fatigue.

## 2014-01-21 ENCOUNTER — Ambulatory Visit: Payer: BC Managed Care – PPO

## 2014-01-21 ENCOUNTER — Ambulatory Visit
Admission: RE | Admit: 2014-01-21 | Discharge: 2014-01-21 | Disposition: A | Discharge status: Home / Self Care | Payer: BC Managed Care – PPO | Source: Ambulatory Visit | Attending: Radiation Oncology | Admitting: Radiation Oncology

## 2014-01-22 ENCOUNTER — Ambulatory Visit
Admission: RE | Admit: 2014-01-22 | Discharge: 2014-01-22 | Disposition: A | Discharge status: Home / Self Care | Payer: BC Managed Care – PPO | Source: Ambulatory Visit | Attending: Radiation Oncology | Admitting: Radiation Oncology

## 2014-01-22 ENCOUNTER — Ambulatory Visit: Payer: BC Managed Care – PPO

## 2014-01-23 ENCOUNTER — Ambulatory Visit
Admission: RE | Admit: 2014-01-23 | Discharge: 2014-01-23 | Disposition: A | Discharge status: Home / Self Care | Payer: BC Managed Care – PPO | Source: Ambulatory Visit | Attending: Radiation Oncology | Admitting: Radiation Oncology

## 2014-01-23 ENCOUNTER — Ambulatory Visit: Payer: BC Managed Care – PPO

## 2014-01-26 ENCOUNTER — Ambulatory Visit: Payer: BC Managed Care – PPO

## 2014-01-26 ENCOUNTER — Ambulatory Visit
Admission: RE | Admit: 2014-01-26 | Discharge: 2014-01-26 | Disposition: A | Discharge status: Home / Self Care | Payer: BC Managed Care – PPO | Source: Ambulatory Visit | Attending: Radiation Oncology | Admitting: Radiation Oncology

## 2014-01-27 ENCOUNTER — Ambulatory Visit: Payer: BC Managed Care – PPO

## 2014-01-27 ENCOUNTER — Ambulatory Visit
Admission: RE | Admit: 2014-01-27 | Discharge: 2014-01-27 | Disposition: A | Discharge status: Home / Self Care | Payer: BC Managed Care – PPO | Source: Ambulatory Visit | Attending: Radiation Oncology | Admitting: Radiation Oncology

## 2014-01-27 DIAGNOSIS — C50412 Malignant neoplasm of upper-outer quadrant of left female breast: Secondary | ICD-10-CM

## 2014-01-27 NOTE — Progress Notes (Signed)
Weekly Management Note Current Dose: 41.4  Gy  Projected Dose: 61 Gy   Narrative:  The patient presents for routine under treatment assessment.  CBCT/MVCT images/Port film x-rays were reviewed.  The chart was checked. Doing well. Skin irritated. Using radiaplex.  Physical Findings: Seen on tx machine to see electron fields.   Impression:  The patient is tolerating radiation.  Plan:  Continue treatment as planned.  Continue radiaplex.

## 2014-01-28 ENCOUNTER — Ambulatory Visit: Payer: BC Managed Care – PPO

## 2014-01-28 ENCOUNTER — Ambulatory Visit
Admission: RE | Admit: 2014-01-28 | Discharge: 2014-01-28 | Disposition: A | Discharge status: Home / Self Care | Payer: BC Managed Care – PPO | Source: Ambulatory Visit | Attending: Radiation Oncology | Admitting: Radiation Oncology

## 2014-01-28 NOTE — Addendum Note (Signed)
Encounter addended by: Thea Silversmith, MD on: 01/28/2014 11:52 AM<BR>     Documentation filed: Notes Section

## 2014-01-29 ENCOUNTER — Ambulatory Visit: Payer: BC Managed Care – PPO

## 2014-01-29 ENCOUNTER — Other Ambulatory Visit: Payer: Self-pay | Admitting: Dermatology

## 2014-01-29 ENCOUNTER — Ambulatory Visit
Admission: RE | Admit: 2014-01-29 | Discharge: 2014-01-29 | Disposition: A | Discharge status: Home / Self Care | Payer: BC Managed Care – PPO | Source: Ambulatory Visit | Attending: Radiation Oncology | Admitting: Radiation Oncology

## 2014-01-30 ENCOUNTER — Ambulatory Visit
Admission: RE | Admit: 2014-01-30 | Discharge: 2014-01-30 | Disposition: A | Discharge status: Home / Self Care | Payer: BC Managed Care – PPO | Source: Ambulatory Visit | Attending: Radiation Oncology | Admitting: Radiation Oncology

## 2014-01-30 ENCOUNTER — Ambulatory Visit: Payer: BC Managed Care – PPO

## 2014-02-02 ENCOUNTER — Ambulatory Visit: Payer: BC Managed Care – PPO

## 2014-02-02 ENCOUNTER — Ambulatory Visit
Admission: RE | Admit: 2014-02-02 | Discharge: 2014-02-02 | Disposition: A | Discharge status: Home / Self Care | Payer: BC Managed Care – PPO | Source: Ambulatory Visit | Attending: Radiation Oncology | Admitting: Radiation Oncology

## 2014-02-03 ENCOUNTER — Ambulatory Visit
Admission: RE | Admit: 2014-02-03 | Discharge: 2014-02-03 | Disposition: A | Discharge status: Home / Self Care | Payer: BC Managed Care – PPO | Source: Ambulatory Visit | Attending: Radiation Oncology | Admitting: Radiation Oncology

## 2014-02-03 ENCOUNTER — Ambulatory Visit: Payer: BC Managed Care – PPO

## 2014-02-03 VITALS — BP 106/50 | HR 74 | Temp 98.8°F | Wt 183.9 lb

## 2014-02-03 DIAGNOSIS — C50412 Malignant neoplasm of upper-outer quadrant of left female breast: Secondary | ICD-10-CM

## 2014-02-03 NOTE — Progress Notes (Signed)
Patient here for weekly assessment of radiation to left breast.Skin is dry and peeling.Patient has completed 3 of 8 of boost.

## 2014-02-03 NOTE — Progress Notes (Signed)
Weekly Management Note Current Dose:  51 Gy  Projected Dose: 61 Gy   Narrative:  The patient presents for routine under treatment assessment.  CBCT/MVCT images/Port film x-rays were reviewed.  The chart was checked. Doing well. Itching in medial aspect of the breast. Using radiaplex.   Physical Findings: Weight: 183 lb 14.4 oz (83.416 kg) . Dry desquamation over left breast. No moist desquamation.   Impression:  The patient is tolerating radiation as expected.   Plan:  Continue treatment as planned. Continue radiaplex. Can add aloe if needed.

## 2014-02-04 ENCOUNTER — Ambulatory Visit
Admission: RE | Admit: 2014-02-04 | Discharge: 2014-02-04 | Disposition: A | Discharge status: Home / Self Care | Payer: BC Managed Care – PPO | Source: Ambulatory Visit | Attending: Radiation Oncology | Admitting: Radiation Oncology

## 2014-02-04 ENCOUNTER — Ambulatory Visit: Payer: BC Managed Care – PPO

## 2014-02-05 ENCOUNTER — Ambulatory Visit
Admission: RE | Admit: 2014-02-05 | Discharge: 2014-02-05 | Disposition: A | Discharge status: Home / Self Care | Payer: BC Managed Care – PPO | Source: Ambulatory Visit | Attending: Radiation Oncology | Admitting: Radiation Oncology

## 2014-02-05 ENCOUNTER — Ambulatory Visit: Payer: BC Managed Care – PPO

## 2014-02-06 ENCOUNTER — Ambulatory Visit: Payer: BC Managed Care – PPO

## 2014-02-06 ENCOUNTER — Ambulatory Visit
Admission: RE | Admit: 2014-02-06 | Discharge: 2014-02-06 | Disposition: A | Discharge status: Home / Self Care | Payer: BC Managed Care – PPO | Source: Ambulatory Visit | Attending: Radiation Oncology | Admitting: Radiation Oncology

## 2014-02-09 ENCOUNTER — Ambulatory Visit: Payer: BC Managed Care – PPO

## 2014-02-10 ENCOUNTER — Ambulatory Visit: Payer: BC Managed Care – PPO

## 2014-02-10 ENCOUNTER — Ambulatory Visit
Admission: RE | Admit: 2014-02-10 | Discharge: 2014-02-10 | Disposition: A | Discharge status: Home / Self Care | Payer: BC Managed Care – PPO | Source: Ambulatory Visit | Attending: Radiation Oncology | Admitting: Radiation Oncology

## 2014-02-10 VITALS — BP 115/73 | HR 75 | Temp 98.1°F | Wt 182.5 lb

## 2014-02-10 DIAGNOSIS — C50412 Malignant neoplasm of upper-outer quadrant of left female breast: Secondary | ICD-10-CM

## 2014-02-10 NOTE — Progress Notes (Signed)
Weekly Management Note Current Dose: 59  Gy  Projected Dose: 61 Gy   Narrative:  The patient presents for routine under treatment assessment.  CBCT/MVCT images/Port film x-rays were reviewed.  The chart was checked. Doing well. Some sun over the weekend. Skin is irritated. Using radiaplex.  Physical Findings: Weight: 182 lb 8 oz (82.781 kg). Tan skin over breast. In area of boost skin is pink.   Impression:  The patient is tolerating radiation.  Plan:  Continue treatment as planned. Discussed sun protection in the treated area. Discussed follow up. Pt would like tamoxifen called in to [facility] on [address].

## 2014-02-11 ENCOUNTER — Ambulatory Visit
Admission: RE | Admit: 2014-02-11 | Discharge: 2014-02-11 | Disposition: A | Discharge status: Home / Self Care | Payer: BC Managed Care – PPO | Source: Ambulatory Visit | Attending: Radiation Oncology | Admitting: Radiation Oncology

## 2014-02-11 ENCOUNTER — Encounter: Payer: Self-pay | Admitting: Radiation Oncology

## 2014-02-12 NOTE — Progress Notes (Signed)
  Radiation Oncology         [phone] ________________________________  Name: Connie [name] MRN: [id number]  Date: 02/11/2014  DOB: 10-Jun-1961  End of Treatment Note  Diagnosis:   DCIS of the left breast     Indication for treatment:  Curative       Radiation treatment dates:   12/24/2013-02/11/2014  Site/dose:    Left breast/ 45 Gy at 1.8 Gy per fraction x 25 fractions.  Left breast boost/ 16 Gy at 2 Gy per fraction x 8 fractions  Beams/energy:  Opposed tangents with reduced fields / 6 MV photons Enface electrons / 15 MeV electrons  Narrative: The patient tolerated radiation treatment relatively well.   She was treated with breath hold technique for cardiac sparing. She had the expected dry desquamation.  Plan: The patient has completed radiation treatment. The patient will return to radiation oncology clinic for routine followup in one month. I advised them to call or return sooner if they have any questions or concerns related to their recovery or treatment. She has follow up with medical oncology as well.   ------------------------------------------------  Thea Silversmith, MD

## 2014-02-17 ENCOUNTER — Other Ambulatory Visit: Payer: Self-pay | Admitting: *Deleted

## 2014-02-23 ENCOUNTER — Telehealth (INDEPENDENT_AMBULATORY_CARE_PROVIDER_SITE_OTHER): Payer: Self-pay

## 2014-02-23 NOTE — Telephone Encounter (Signed)
See below msg 

## 2014-02-23 NOTE — Telephone Encounter (Signed)
Message copied by Carlene Coria on Mon Feb 23, 2014  2:09 PM ------      Message from: Humphrey Rolls K      Created: Mon Feb 23, 2014 12:01 PM      Regarding: Dr Brantley Stage      Contact: [phone]       Working LTFU: Patient doesn't feel the need to come back. Says she will call if needed. ------

## 2014-02-26 ENCOUNTER — Other Ambulatory Visit: Payer: Self-pay | Admitting: *Deleted

## 2014-02-26 MED ORDER — TAMOXIFEN CITRATE 20 MG PO TABS: 20.0000 mg | ORAL_TABLET | Freq: Every day | ORAL | Status: DC

## 2014-03-19 ENCOUNTER — Ambulatory Visit: Payer: BC Managed Care – PPO | Admitting: Radiation Oncology

## 2014-04-10 ENCOUNTER — Ambulatory Visit
Admission: RE | Admit: 2014-04-10 | Discharge: 2014-04-10 | Disposition: A | Discharge status: Home / Self Care | Payer: BC Managed Care – PPO | Source: Ambulatory Visit | Attending: Radiation Oncology | Admitting: Radiation Oncology

## 2014-04-10 VITALS — BP 112/71 | HR 80 | Temp 99.0°F | Wt 169.2 lb

## 2014-04-10 DIAGNOSIS — C50412 Malignant neoplasm of upper-outer quadrant of left female breast: Secondary | ICD-10-CM

## 2014-04-10 NOTE — Progress Notes (Signed)
Patient here for routine one month follow up post radiation to left breast.Denies pain.skin has healed completely just mild tanning of skin.Started tamoxifen about one month ago.Intentional weight loss of 12 lbs over last 30 days.Patient was attacked by pit bull and sustained some scratches and facial bruising  but has recovered nicely.

## 2014-04-10 NOTE — Progress Notes (Signed)
   Department of Radiation Oncology  Phone:  [phone] Fax:        [phone]   Name: Connie [name] MRN: [id number]  DOB: [birth year]  Date: 04/10/2014  Follow Up Visit Note  Diagnosis: DCIS of the left breast  Summary and Interval since last radiation: 61 Gy completed 02/11/14  Interval History: Connie [name] presents today for routine followup.  She has healed up well. She was attacked by a neighbor's dog on her last day of RT!  Tamoxifen is giving her hot flashes.   Allergies: No Known Allergies  Medications:  Current Outpatient Prescriptions  Medication Sig Dispense Refill  . adalimumab (HUMIRA PEN STARTER) 40 MG/0.8ML injection Inject 40 mg into the skin once.      . propranolol (INDERAL) 60 MG tablet Take 60 mg by mouth daily. Takes for esoph varies from having hep c-treated now      . tamoxifen (NOLVADEX) 20 MG tablet Take 1 tablet (20 mg total) by mouth daily.  30 tablet  6  . triamcinolone cream (KENALOG) 0.1 % Apply 1 application topically 2 (two) times daily. Mixed with Cetaphil       No current facility-administered medications for this encounter.    Physical Exam:  Filed Vitals:   04/10/14 1604  BP: 112/71  Pulse: 80  Temp: 99 F (37.2 C)  Weight: 169 lb 3.2 oz (76.749 kg)   Dry skin over the left breast.   IMPRESSION: Connie [name] is a 53 y.o. female with resolving acute effects of treatment.   PLAN:  Doing well. Follow up with medical oncology. Discussed sun protection in the treated area. Discussed yearly mammograms. Start lotion with vitamin E. Can follow up prn.     Thea Silversmith, MD

## 2014-06-08 ENCOUNTER — Ambulatory Visit (HOSPITAL_BASED_OUTPATIENT_CLINIC_OR_DEPARTMENT_OTHER): Payer: BC Managed Care – PPO | Admitting: Oncology

## 2014-06-08 VITALS — BP 110/79 | HR 75 | Temp 98.3°F | Resp 18 | Ht 66.0 in | Wt 170.5 lb

## 2014-06-08 DIAGNOSIS — Z170 Estrogen receptor positive status [ER+]: Secondary | ICD-10-CM

## 2014-06-08 DIAGNOSIS — D0590 Unspecified type of carcinoma in situ of unspecified breast: Secondary | ICD-10-CM

## 2014-06-08 DIAGNOSIS — N951 Menopausal and female climacteric states: Secondary | ICD-10-CM

## 2014-06-08 DIAGNOSIS — L408 Other psoriasis: Secondary | ICD-10-CM

## 2014-06-08 DIAGNOSIS — B182 Chronic viral hepatitis C: Secondary | ICD-10-CM

## 2014-06-08 DIAGNOSIS — L409 Psoriasis, unspecified: Secondary | ICD-10-CM

## 2014-06-08 DIAGNOSIS — C50412 Malignant neoplasm of upper-outer quadrant of left female breast: Secondary | ICD-10-CM

## 2014-06-08 NOTE — Addendum Note (Signed)
Addended by: Laureen Abrahams on: 06/08/2014 05:48 PM   Modules accepted: Orders, Medications

## 2014-06-08 NOTE — Progress Notes (Signed)
ID: Connie [name] OB: 01/10/1961  MR#: [id number]  ZES#:[id number]  PCP: Daphene Jaeger, PA-C GYN:   SU: Erroll Luna OTHER MD: Melody Comas  Teresita Madura, Nena Polio  CHIEF COMPLAINT: Ductal carcinoma in situ  CURRENT TREATMENT:   BREAST CANCER HISTORY: From the original intake note: The patient had routine screening bilateral mammography at the breast center 07/15/2013 showing calcifications in the left breast which call for further evaluation. On 08/12/2013 limited diagnostic mammography of the left breast confirmed a group of calcifications in the upper outer quadrant spanning a distance of approximately 9 mm.  Biopsy of this area on 08/18/2013 showed ([id number]) ductal carcinoma in situ, intermediate grade, estrogen and progesterone receptor positive by verbal report. A second biopsy that proved to be a fibroadenoma.  Breast MRI 08/26/2013 showed an area of clumped nodular enhancement associated with clip artifact in the left breast measuring 1.7 cm. A second area inferior and medial to the first also showed irregular enhancement. There were no abnormal appearing lymph nodes. There was mild inhomogeneous marrow signal in the sternum without focal lesion found. Biopsy of the second area of concern in the left breast is planned.  The patient's subsequent history is as detailed below  INTERVAL HISTORY: Connie [name] returns today for followup of her breast cancer. Since her last visit here she completed her radiation treatments. She tolerated those well. She had mild fatigue. She did not have significant skin changes and those she had have resolved. She then started tamoxifen early June. She is having significant hot flashes associated with that. She has not had any other problems so far from it and particularly no vaginal discharge or wetness.  REVIEW OF SYSTEMS: Connie [name] continues to struggle with a psoriasis. The Humira did not work for her. 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 A detailed room systems today was otherwise negative.  PAST MEDICAL HISTORY: Past Medical History  Diagnosis Date  . Psoriasis   . Hepatitis C     had the 12 week treatment-now gone  . 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 Wears glasses   . Cancer     breast  . PONV (postoperative nausea and vomiting)   . Radiation 12/24/13-02/11/14    Left Breast/DCIS 61 Gy   psoriasis  PAST SURGICAL HISTORY: Past Surgical History  Procedure Laterality Date  . Caesaran   [id number]    x2  . Upper gi endoscopy    . Tubal ligation  2001    after last c-sec  . Breast lumpectomy with needle localization Left 09/10/2013    Procedure: BREAST LUMPECTOMY WITH NEEDLE LOCALIZATION (two);  Surgeon: Joyice Faster. Cornett, MD;  Location: [facility];  Service: General;  Laterality: Left;  . Cesarean section      2  . Breast surgery      lumpectomy on left  . Breast lumpectomy with needle localization Left 11/11/2013    Procedure: BREAST LUMPECTOMY WITH NEEDLE LOCALIZATION;  Surgeon: Marcello Moores A. Cornett, MD;  Location: [facility];  Service: General;  Laterality: Left;    FAMILY HISTORY Family History  Problem Relation Age of Onset  . Heart attack Father 22    Deceased  . Heart disease Father   . Congestive Heart Failure Mother 6    deceased  . Heart disease Mother    The patient's father died from a myocardial infarction at the age of 60. The patient's mother died  with congestive heart failure secondary to myocarditis at the age of 51. The patient had one brother, and one sister. There is no history of breast or ovarian cancer in the family to her knowledge.  GYNECOLOGIC HISTORY:  Menarche age 66, first live birth age 55, the patient is GX P2. Last menstrual period was approximately 2008. She did not take hormone replacement.  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 Son Aida Puffer works as  a Optometrist. Son Anela Bensman 13 lives with the patient and her husband. There are no grandchildren. The patient attends [employer].    ADVANCED DIRECTIVES: Not in place   HEALTH MAINTENANCE: History  Substance Use Topics  . Smoking status: Former Smoker -- 0.50 packs/day    Types: Cigarettes    Quit date: 08/30/2013  . Smokeless tobacco: Not on file  . Alcohol Use: No     Colonoscopy: 10/09/2013 showed only a hyperplastic polyp  PAP:  Bone density:  Lipid panel:  No Known Allergies  Current Outpatient Prescriptions  Medication Sig Dispense Refill  . adalimumab (HUMIRA PEN STARTER) 40 MG/0.8ML injection Inject 40 mg into the skin once.      . propranolol (INDERAL) 60 MG tablet Take 60 mg by mouth daily. Takes for esoph varies from having hep c-treated now      . tamoxifen (NOLVADEX) 20 MG tablet Take 1 tablet (20 mg total) by mouth daily.  30 tablet  6  . triamcinolone cream (KENALOG) 0.1 % Apply 1 application topically 2 (two) times daily. Mixed with Cetaphil       No current facility-administered medications for this visit.    OBJECTIVE: Middle-aged white woman in no acute distress  Filed Vitals:   06/08/14 1552  BP: 110/79  Pulse: 75  Temp: 98.3 F (36.8 C)  Resp: 18     Body mass index is 27.53 kg/(m^2).    ECOG FS:1 - Symptomatic but completely ambulatory  Sclerae unicteric, pupils are round and equal Oropharynx clear and moist No cervical or supraclavicular adenopathy Lungs no rales or rhonchi Heart regular rate and rhythm Abd soft, nontender, positive bowel sounds MSK no focal spinal tenderness, no upper extremity lymphedema Neuro: nonfocal, well oriented, positive affect Breasts: The right breast is unremarkable. The left breast is status post lumpectomy and radiation. The cosmetic result is excellent. There are no suspicious findings. The left axilla is benign.   LAB RESULTS:  CMP     Component Value Date/Time   NA 143 08/27/2013  0814   NA 141 11/22/2011 1106   K 3.9 08/27/2013 0814   K 3.8 11/22/2011 1106   CL 106 11/22/2011 1106   CO2 24 08/27/2013 0814   CO2 26 11/22/2011 1106   GLUCOSE 92 08/27/2013 0814   GLUCOSE 85 11/22/2011 1106   BUN 17.4 08/27/2013 0814   BUN 10 11/22/2011 1106   CREATININE 0.8 08/27/2013 0814   CREATININE 0.61 11/22/2011 1106   CALCIUM 9.7 08/27/2013 0814   CALCIUM 9.3 11/22/2011 1106   PROT 7.1 08/27/2013 0814   PROT 7.2 11/22/2011 1106   ALBUMIN 3.6 08/27/2013 0814   ALBUMIN 3.9 11/22/2011 1106   AST 35* 08/27/2013 0814   AST 346* 11/22/2011 1106   ALT 42 08/27/2013 0814   ALT 422* 11/22/2011 1106   ALKPHOS 89 08/27/2013 0814   ALKPHOS 154* 11/22/2011 1106   BILITOT 0.68 08/27/2013 0814   BILITOT 1.5* 11/22/2011 1106  I No results found for this basename: SPEP,  UPEP,   kappa and lambda light chains    Lab Results  Component Value Date   WBC 6.3 08/27/2013   NEUTROABS 3.0 08/27/2013   HGB 14.7 08/27/2013   HCT 43.2 08/27/2013   MCV 94.9 08/27/2013   PLT 139* 08/27/2013      Chemistry      Component Value Date/Time   NA 143 08/27/2013 0814   NA 141 11/22/2011 1106   K 3.9 08/27/2013 0814   K 3.8 11/22/2011 1106   CL 106 11/22/2011 1106   CO2 24 08/27/2013 0814   CO2 26 11/22/2011 1106   BUN 17.4 08/27/2013 0814   BUN 10 11/22/2011 1106   CREATININE 0.8 08/27/2013 0814   CREATININE 0.61 11/22/2011 1106      Component Value Date/Time   CALCIUM 9.7 08/27/2013 0814   CALCIUM 9.3 11/22/2011 1106   ALKPHOS 89 08/27/2013 0814   ALKPHOS 154* 11/22/2011 1106   AST 35* 08/27/2013 0814   AST 346* 11/22/2011 1106   ALT 42 08/27/2013 0814   ALT 422* 11/22/2011 1106   BILITOT 0.68 08/27/2013 0814   BILITOT 1.5* 11/22/2011 1106       No results found for this basename: LABCA2    No components found with this basename: [id number]    No results found for this basename: INR,  in the last 168 hours  Urinalysis    Component Value Date/Time   BILIRUBINUR small 11/22/2011 1106   PROTEINUR neg 11/22/2011  1106   UROBILINOGEN 2.0 11/22/2011 1106   NITRITE neg 11/22/2011 1106   LEUKOCYTESUR Negative 11/22/2011 1106    STUDIES: No results found.  ASSESSMENT: 53 y.o. [employer] woman status post left breast biopsy 08/18/2013 for a ductal carcinoma in situ, grade 2, estrogen and progesterone receptor positive.  (1) status post MRI guided biopsy 09/02/2013 of a second area in the left breast, showing an additional 2 mm of ductal carcinoma in situ, with margins negative but close  (2) postoperative mammography to 12/05/2013 showed a 5 mm group of residual calcifications in the left upper breast, which were removed 11/11/2013, showing only lobular neoplasia. Margins were clearly negative.  (3) adjuvant radiation completed 02/11/2014  (4) tamoxifen started 02/16/2014, the plan being to continue for 5 years.  (5) psoriasis, started on adalimumab/ Humira February 2015  PLAN: We spent approximately 30 minutes going over Connie [name]'s situation in detail. She understands her tumor was noninvasive and therefore not life threatening. She has had optimal local treatment. Her risk of local recurrence is very low, I would say less than 10%, and therefore even though tamoxifen would cut that risk in half, the benefit would be small, perhaps in the 4% range.  Her risk of developing another breast cancer in her lifetime is greater. It is approximately 1% or so in patients like her. Tamoxifen would cut that risk in half as well. This is a more considerable benefit.  She again reviewed the possible side effects, toxicities and complications of tamoxifen. What she would like to do is continue tamoxifen to try to improve on the hot flashes. I think this is a reasonable plan.  Accordingly we are starting her on gabapentin at bedtime. Hopefully this will help her sleep more through the night. We are not addressing the daytime hot flashes at present. These are less of a concern to her.  She will be seeing her primary care  physician in October. Accordingly she will return to  see me in April. Hopefully by then the hot flashes will improve. Otherwise we'll consider adding venlafaxine.  Connie [name] has a good understanding of this plan. She is in agreement with that. She knows a goal of treatment is cure and prevention. She will call for any problems that may develop before her next visit here.   Chauncey Cruel, MD   06/08/2014 4:11 PM

## 2014-06-09 ENCOUNTER — Telehealth: Payer: Self-pay | Admitting: Oncology

## 2014-06-09 NOTE — Telephone Encounter (Signed)
per pof to sch pt appt-cld pt & left message of time & date of appt-will mail copy of sch

## 2014-09-30 ENCOUNTER — Other Ambulatory Visit: Payer: Self-pay | Admitting: Nurse Practitioner

## 2014-09-30 DIAGNOSIS — Z9889 Other specified postprocedural states: Secondary | ICD-10-CM

## 2014-09-30 DIAGNOSIS — Z853 Personal history of malignant neoplasm of breast: Secondary | ICD-10-CM

## 2014-10-06 ENCOUNTER — Other Ambulatory Visit: Payer: Self-pay | Admitting: Surgery

## 2014-10-06 DIAGNOSIS — Z9889 Other specified postprocedural states: Secondary | ICD-10-CM

## 2014-10-06 DIAGNOSIS — Z853 Personal history of malignant neoplasm of breast: Secondary | ICD-10-CM

## 2014-10-08 ENCOUNTER — Ambulatory Visit
Admission: RE | Admit: 2014-10-08 | Discharge: 2014-10-08 | Disposition: A | Discharge status: Home / Self Care | Payer: 59 | Source: Ambulatory Visit | Attending: Nurse Practitioner | Admitting: Nurse Practitioner

## 2014-10-08 ENCOUNTER — Other Ambulatory Visit: Payer: Self-pay | Admitting: Nurse Practitioner

## 2014-10-08 ENCOUNTER — Other Ambulatory Visit: Payer: Self-pay | Admitting: Surgery

## 2014-10-08 ENCOUNTER — Ambulatory Visit
Admission: RE | Admit: 2014-10-08 | Discharge: 2014-10-08 | Disposition: A | Discharge status: Home / Self Care | Payer: 59 | Source: Ambulatory Visit | Attending: Surgery | Admitting: Surgery

## 2014-10-08 DIAGNOSIS — R079 Chest pain, unspecified: Secondary | ICD-10-CM

## 2014-10-08 DIAGNOSIS — Z9889 Other specified postprocedural states: Secondary | ICD-10-CM

## 2014-10-08 DIAGNOSIS — Z853 Personal history of malignant neoplasm of breast: Secondary | ICD-10-CM

## 2015-01-12 ENCOUNTER — Telehealth: Payer: Self-pay | Admitting: Oncology

## 2015-01-12 ENCOUNTER — Ambulatory Visit (HOSPITAL_BASED_OUTPATIENT_CLINIC_OR_DEPARTMENT_OTHER): Payer: 59 | Admitting: Oncology

## 2015-01-12 VITALS — BP 114/65 | HR 84 | Temp 98.6°F | Resp 18 | Ht 66.0 in | Wt 182.3 lb

## 2015-01-12 DIAGNOSIS — C50412 Malignant neoplasm of upper-outer quadrant of left female breast: Secondary | ICD-10-CM

## 2015-01-12 DIAGNOSIS — L409 Psoriasis, unspecified: Secondary | ICD-10-CM

## 2015-01-12 DIAGNOSIS — D0512 Intraductal carcinoma in situ of left breast: Secondary | ICD-10-CM | POA: Diagnosis not present

## 2015-01-12 DIAGNOSIS — Z170 Estrogen receptor positive status [ER+]: Secondary | ICD-10-CM

## 2015-01-12 DIAGNOSIS — N951 Menopausal and female climacteric states: Secondary | ICD-10-CM

## 2015-01-12 DIAGNOSIS — B1710 Acute hepatitis C without hepatic coma: Secondary | ICD-10-CM

## 2015-01-12 MED ORDER — TAMOXIFEN CITRATE 20 MG PO TABS: 20.0000 mg | ORAL_TABLET | Freq: Every day | ORAL | Status: DC

## 2015-01-12 MED ORDER — GABAPENTIN 300 MG PO CAPS: 300.0000 mg | ORAL_CAPSULE | Freq: Three times a day (TID) | ORAL | Status: DC

## 2015-01-12 NOTE — Telephone Encounter (Signed)
pof printed and held to call patient for one year survivorship

## 2015-01-12 NOTE — Progress Notes (Signed)
ID: Loma Messing OB: 12/02/1960  MR#: [id number]  WUJ#:[id number]  PCP: Daphene Jaeger, PA-C GYN:   SU: Erroll Luna OTHER MD: Melody Comas  Teresita Madura, Nena Polio  CHIEF COMPLAINT: Ductal carcinoma in situ  CURRENT TREATMENT: Tamoxifen   BREAST CANCER HISTORY: From the original intake note:  The patient had routine screening bilateral mammography at the breast center 07/15/2013 showing calcifications in the left breast which call for further evaluation. On 08/12/2013 limited diagnostic mammography of the left breast confirmed a group of calcifications in the upper outer quadrant spanning a distance of approximately 9 mm.  Biopsy of this area on 08/18/2013 showed ([id number]) ductal carcinoma in situ, intermediate grade, estrogen and progesterone receptor positive by verbal report. A second biopsy that proved to be a fibroadenoma.  Breast MRI 08/26/2013 showed an area of clumped nodular enhancement associated with clip artifact in the left breast measuring 1.7 cm. A second area inferior and medial to the first also showed irregular enhancement. There were no abnormal appearing lymph nodes. There was mild inhomogeneous marrow signal in the sternum without focal lesion found. Biopsy of the second area of concern in the left breast is planned.  The patient's subsequent history is as detailed below  INTERVAL HISTORY: Connie [name] returns today for followup of her breast cancer. She continues on tamoxifen, with excellent tolerance. She has hot flashes sometimes but they're not a major problem. She doesn't have night sweats. She doesn't have a vaginal discharge. She obtains a drug at a very good price.  REVIEW OF SYSTEMS: Connie [name] was feeling a lot of stress, but is managing it better. First of all she started antidepressants. Then she takes walks. She is taking time for herself. She is doing some gardening. She is still working closely with her husband ("we are together  24 hours a day 7 days a week") but she is a little freer to leave work and do what she needs. Her psoriasis is also better on the new medication. Sometimes she has pain in her left axilla and chest wall. She doesn't have chest pain as an cardiac chest pain. A detailed review of systems today was otherwise stable  PAST MEDICAL HISTORY: Past Medical History  Diagnosis Date  . Psoriasis   . Hepatitis C     had the 12 week treatment-now gone  . 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 Wears glasses   . Cancer     breast  . PONV (postoperative nausea and vomiting)   . Radiation 12/24/13-02/11/14    Left Breast/DCIS 61 Gy   psoriasis  PAST SURGICAL HISTORY: Past Surgical History  Procedure Laterality Date  . Caesaran   [id number]    x2  . Upper gi endoscopy    . Tubal ligation  2001    after last c-sec  . Breast lumpectomy with needle localization Left 09/10/2013    Procedure: BREAST LUMPECTOMY WITH NEEDLE LOCALIZATION (two);  Surgeon: Joyice Faster. Cornett, MD;  Location: [facility];  Service: General;  Laterality: Left;  . Cesarean section      2  . Breast surgery      lumpectomy on left  . Breast lumpectomy with needle localization Left 11/11/2013    Procedure: BREAST LUMPECTOMY WITH NEEDLE LOCALIZATION;  Surgeon: Marcello Moores A. Cornett, MD;  Location: [facility];  Service: General;  Laterality: Left;    FAMILY HISTORY Family History  Problem Relation Age of Onset  .  Heart attack Father 26    Deceased  . Heart disease Father   . Congestive Heart Failure Mother 32    deceased  . Heart disease Mother    The patient's father died from a myocardial infarction at the age of 36. The patient's mother died with congestive heart failure secondary to myocarditis at the age of 58. The patient had one brother, and one sister. There is no history of breast or ovarian cancer in the family to her knowledge.  GYNECOLOGIC HISTORY:  Menarche age 33, first live birth age 67,  the patient is GX P2. Last menstrual period was approximately 2008. She did not take hormone replacement.  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 Son Aida Puffer works as a Optometrist. Son Saliah Crisp 13 lives with the patient and her husband. There are no grandchildren. The patient attends [employer].    ADVANCED DIRECTIVES: Not in place   HEALTH MAINTENANCE: History  Substance Use Topics  . Smoking status: Former Smoker -- 0.50 packs/day    Types: Cigarettes    Quit date: 08/30/2013  . Smokeless tobacco: Not on file  . Alcohol Use: No     Colonoscopy: 10/09/2013 showed only a hyperplastic polyp  PAP:  Bone density:  Lipid panel:  No Known Allergies  Current Outpatient Prescriptions  Medication Sig Dispense Refill  . escitalopram (LEXAPRO) 20 MG tablet Take 1 tablet (20 mg total) by mouth daily.    Marland Kitchen gabapentin (NEURONTIN) 300 MG capsule Take 1 capsule (300 mg total) by mouth 3 (three) times daily. 90 capsule 4  . omeprazole (PRILOSEC) 20 MG capsule Take 1 capsule (20 mg total) by mouth daily.    . propranolol (INDERAL) 60 MG tablet Take 60 mg by mouth daily. Takes for esoph varies from having hep c-treated now    . tamoxifen (NOLVADEX) 20 MG tablet Take 1 tablet (20 mg total) by mouth daily. 30 tablet 6  . Ustekinumab 90 MG/ML SOSY Inject into the skin.     No current facility-administered medications for this visit.    OBJECTIVE: Middle-aged white woman who appears stated age 54 Vitals:   01/12/15 1421  BP: 114/65  Pulse: 84  Temp: 98.6 F (37 C)  Resp: 18     Body mass index is 29.44 kg/(m^2).    ECOG FS:0 - Asymptomatic  Sclerae unicteric, EOMs intact Oropharynx clear, dentition in good repair No cervical or supraclavicular adenopathy Lungs no rales or rhonchi Heart regular rate and rhythm Abd soft, obese, nontender, positive bowel sounds MSK no focal spinal tenderness, no upper  extremity lymphedema Neuro: nonfocal, well oriented, friendly affect Breasts: The right breast is unremarkable. The left breast is status post lumpectomy and radiation. There is no evidence of local recurrence The left axilla is benign.   LAB RESULTS:  CMP     Component Value Date/Time   NA 143 08/27/2013 0814   NA 141 11/22/2011 1106   K 3.9 08/27/2013 0814   K 3.8 11/22/2011 1106   CL 106 11/22/2011 1106   CO2 24 08/27/2013 0814   CO2 26 11/22/2011 1106   GLUCOSE 92 08/27/2013 0814   GLUCOSE 85 11/22/2011 1106   BUN 17.4 08/27/2013 0814   BUN 10 11/22/2011 1106   CREATININE 0.8 08/27/2013 0814   CREATININE 0.61 11/22/2011 1106   CALCIUM 9.7 08/27/2013 0814   CALCIUM 9.3 11/22/2011 1106   PROT 7.1 08/27/2013 0814  PROT 7.2 11/22/2011 1106   ALBUMIN 3.6 08/27/2013 0814   ALBUMIN 3.9 11/22/2011 1106   AST 35* 08/27/2013 0814   AST 346* 11/22/2011 1106   ALT 42 08/27/2013 0814   ALT 422* 11/22/2011 1106   ALKPHOS 89 08/27/2013 0814   ALKPHOS 154* 11/22/2011 1106   BILITOT 0.68 08/27/2013 0814   BILITOT 1.5* 11/22/2011 1106    I No results found for: SPEP  Lab Results  Component Value Date   WBC 6.3 08/27/2013   NEUTROABS 3.0 08/27/2013   HGB 14.7 08/27/2013   HCT 43.2 08/27/2013   MCV 94.9 08/27/2013   PLT 139* 08/27/2013      Chemistry      Component Value Date/Time   NA 143 08/27/2013 0814   NA 141 11/22/2011 1106   K 3.9 08/27/2013 0814   K 3.8 11/22/2011 1106   CL 106 11/22/2011 1106   CO2 24 08/27/2013 0814   CO2 26 11/22/2011 1106   BUN 17.4 08/27/2013 0814   BUN 10 11/22/2011 1106   CREATININE 0.8 08/27/2013 0814   CREATININE 0.61 11/22/2011 1106      Component Value Date/Time   CALCIUM 9.7 08/27/2013 0814   CALCIUM 9.3 11/22/2011 1106   ALKPHOS 89 08/27/2013 0814   ALKPHOS 154* 11/22/2011 1106   AST 35* 08/27/2013 0814   AST 346* 11/22/2011 1106   ALT 42 08/27/2013 0814   ALT 422* 11/22/2011 1106   BILITOT 0.68 08/27/2013 0814    BILITOT 1.5* 11/22/2011 1106       No results found for: LABCA2  No components found for: [id number]  No results for input(s): INR in the last 168 hours.  Urinalysis    Component Value Date/Time   BILIRUBINUR small 11/22/2011 1106   PROTEINUR neg 11/22/2011 1106   UROBILINOGEN 2.0 11/22/2011 1106   NITRITE neg 11/22/2011 1106   LEUKOCYTESUR Negative 11/22/2011 1106    STUDIES: CLINICAL DATA: Chest pain.  EXAM: CHEST 2 VIEW  COMPARISON: None.  FINDINGS: The heart size and mediastinal contours are within normal limits. Both lungs are clear. No pneumothorax or pleural effusion is noted. The visualized skeletal structures are unremarkable.  IMPRESSION: No acute cardiopulmonary abnormality seen.   Electronically Signed  By: Sabino Dick M.D.  On: 10/08/2014 17:27  ASSESSMENT: 54 y.o. [place] woman status post left breast biopsy 08/18/2013 for a ductal carcinoma in situ, grade 2, estrogen and progesterone receptor positive.  (1) status post MRI guided biopsy 09/02/2013 of a second area in the left breast, showing an additional 2 mm of ductal carcinoma in situ, with margins negative but close  (2) postoperative mammography to 12/05/2013 showed a 5 mm group of residual calcifications in the left upper breast, which were removed 11/11/2013, showing only lobular neoplasia. Margins were clearly negative.  (3) adjuvant radiation completed 02/11/2014  (4) tamoxifen started 02/16/2014, the plan being to continue for 5 years.  (5) psoriasis, currently on Aldara   PLAN: Connie [name] Is doing well with tamoxifen and the plan will be to continue the drug for 5 years, at which point she will graduate. He is also on gabapentin for the nighttime hot flashes, which have become much better controlled on that medication. I updated her med list today and refilled the 2 medications that we prescribed for her.  She will be a good candidate for our survivorship breast clinic and  she will see our nurse practitioner yearly until the final visit, at which point she will see me again.  Connie [name]  knows to call for any problems that may develop before her next visit here.    Chauncey Cruel, MD   01/12/2015 2:40 PM

## 2015-06-12 ENCOUNTER — Other Ambulatory Visit: Payer: Self-pay | Admitting: Oncology

## 2015-07-02 IMAGING — MG MM DIGITAL SCREENING BILAT W/ CAD
9 of 12 series · 9 of 28 positions shown · non-contrast
Comparison: Previous Exam(s)

CLINICAL DATA: Screening.

EXAM:
DIGITAL SCREENING BILATERAL MAMMOGRAM WITH CAD
DIGITAL BREAST TOMOSYNTHESIS
Digital breast tomosynthesis images are acquired in two projections.
These images are reviewed in combination with the digital mammogram,
confirming the findings below.

[L CC]
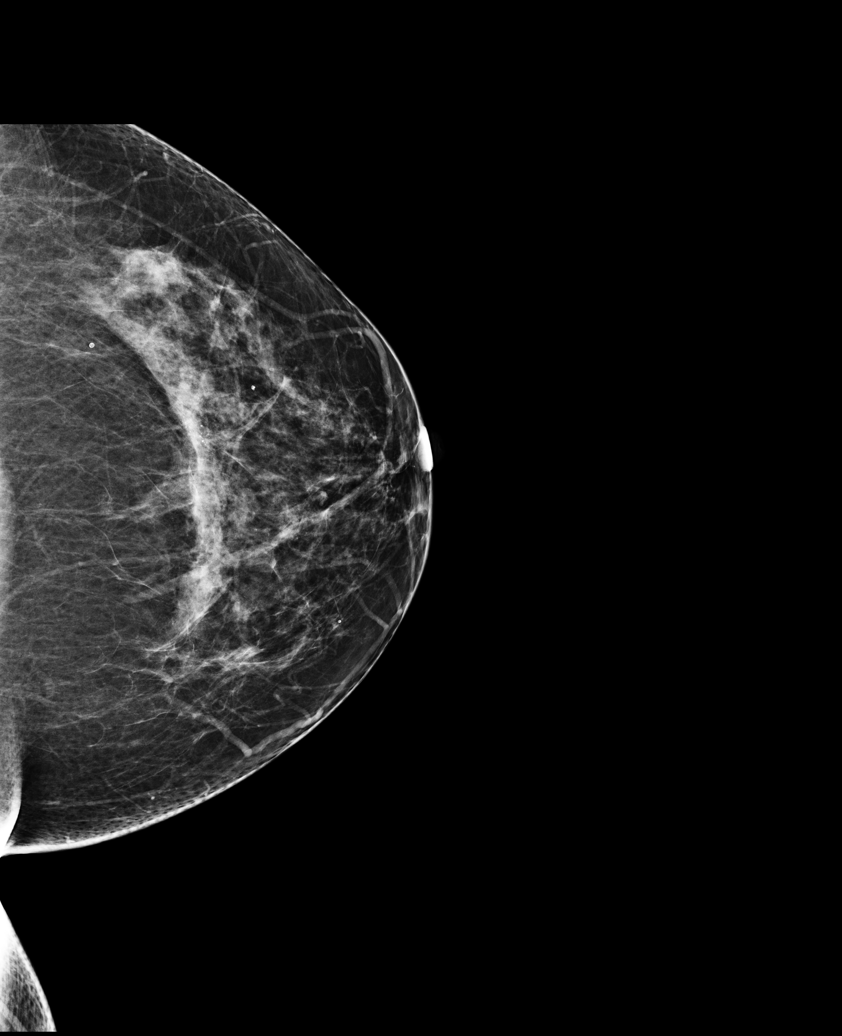

[R CC]
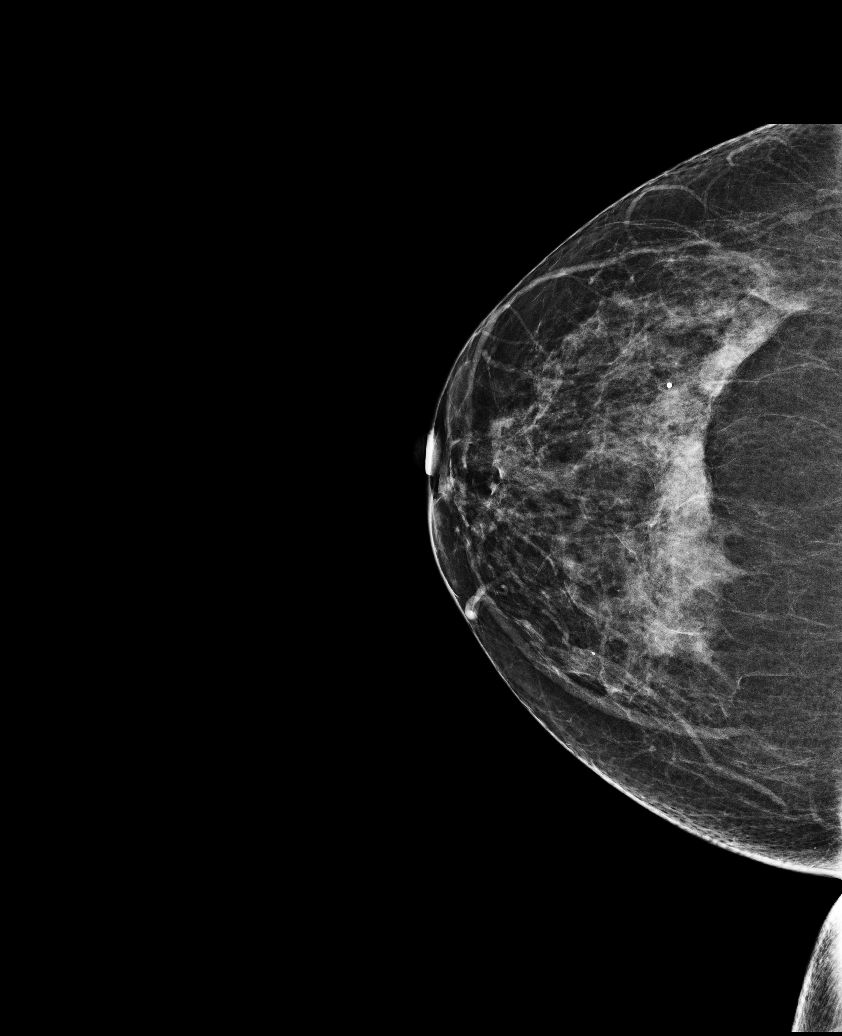

[R MLO]
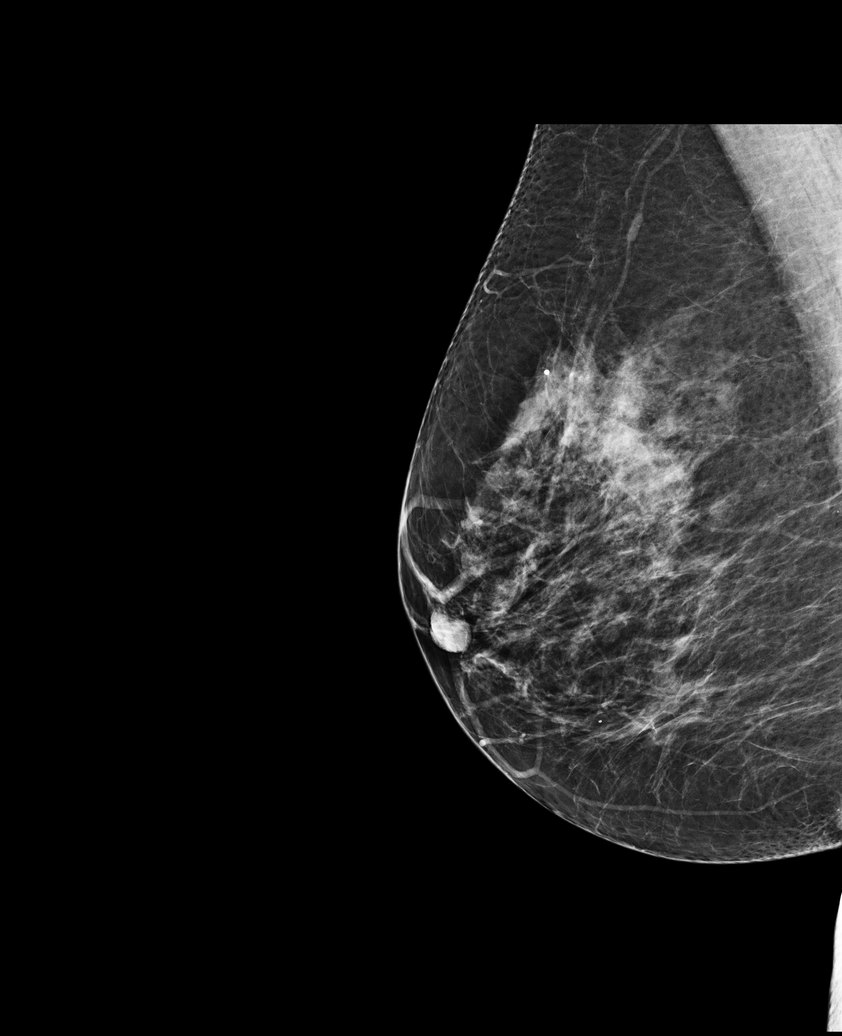

[L MLO]
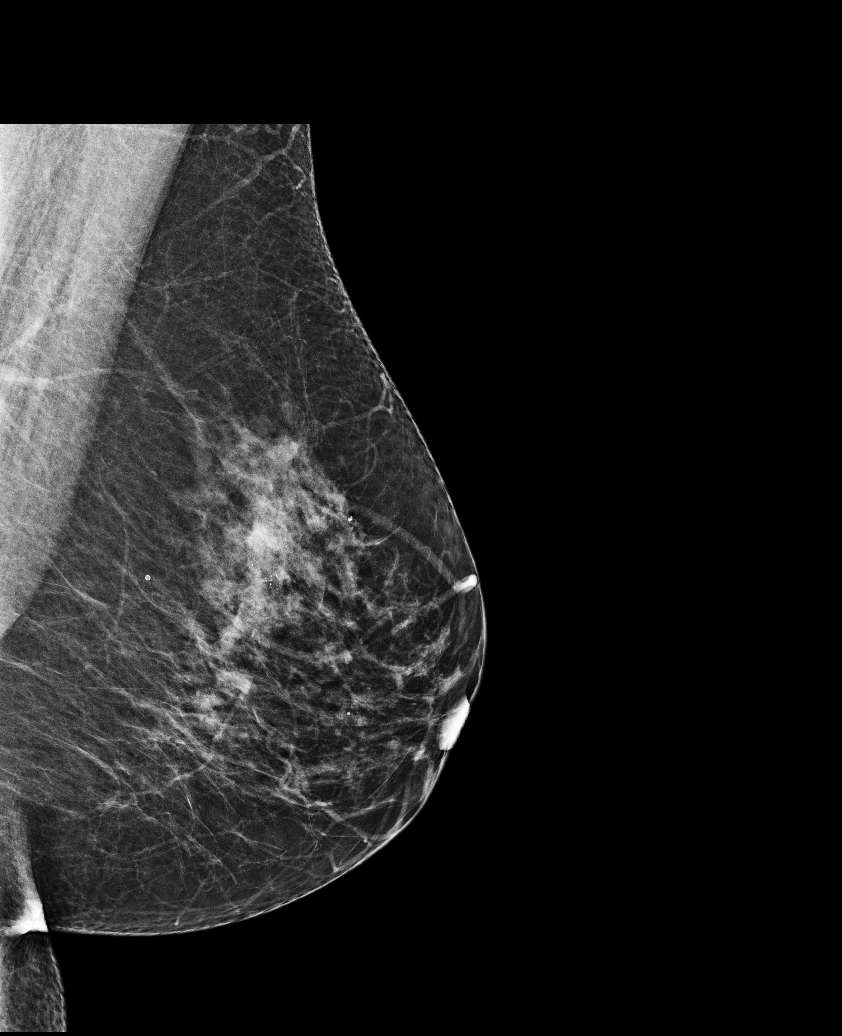

[R MLO tomo]
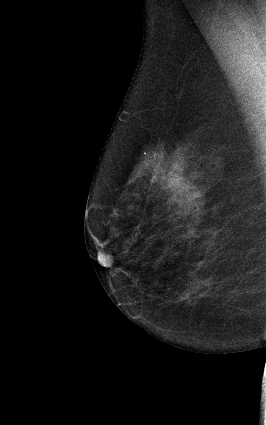

[L MLO tomo]
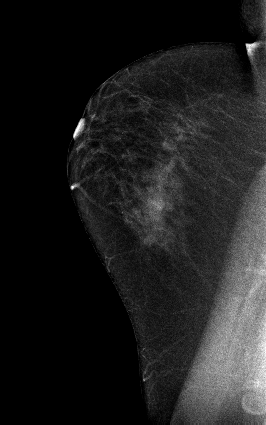

[R CC tomo (1 of 2)]
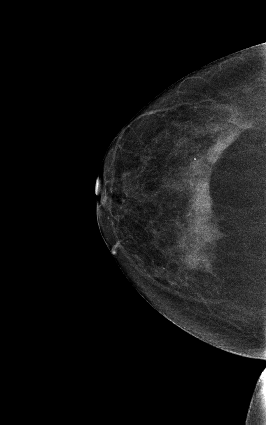

[L CC tomo]
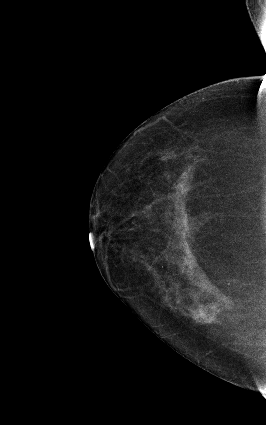

[R CC tomo (2 of 2) · tomo slice 35/68.0]
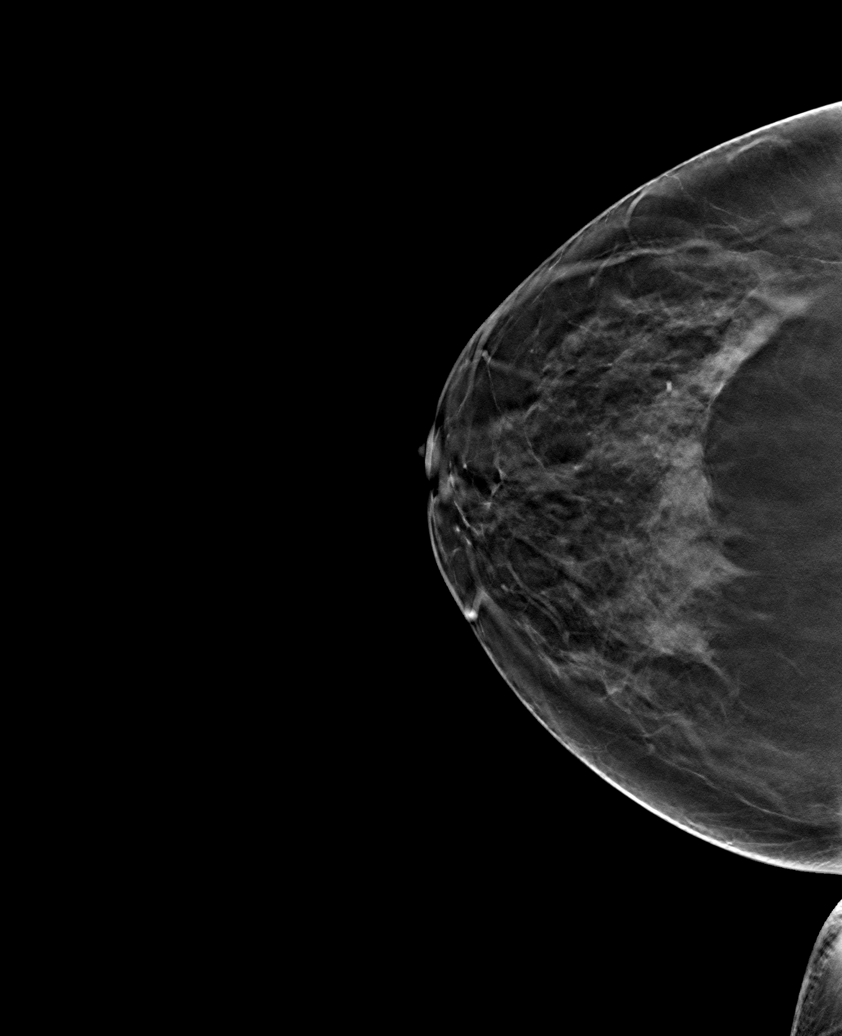

[9 of 28 positions shown; findings below may reference images not displayed]

ACR Breast Density Category b: There are scattered areas of
fibroglandular density.
FINDINGS: In the left breast, calcifications warrant further evaluation with
magnified views. In the right breast, no mass or malignant type
calcifications are identified. Images were processed with CAD.
IMPRESSION: Further evaluation is suggested for calcifications in the left
breast.

RECOMMENDATION:
Diagnostic mammogram of the left breast. (Code:[id number])

The patient will be contacted regarding the findings, and additional
imaging will be scheduled.

BI-RADS CATEGORY  0. Incomplete. Need additional imaging evaluation
and/or prior mammograms for comparison.

## 2015-07-05 ENCOUNTER — Other Ambulatory Visit: Payer: Self-pay | Admitting: *Deleted

## 2015-07-05 DIAGNOSIS — C50412 Malignant neoplasm of upper-outer quadrant of left female breast: Secondary | ICD-10-CM

## 2015-07-05 MED ORDER — TAMOXIFEN CITRATE 20 MG PO TABS: 20.0000 mg | ORAL_TABLET | Freq: Every day | ORAL | Status: DC

## 2015-10-04 ENCOUNTER — Other Ambulatory Visit: Payer: Self-pay | Admitting: Surgery

## 2015-10-04 DIAGNOSIS — Z9889 Other specified postprocedural states: Secondary | ICD-10-CM

## 2015-10-04 DIAGNOSIS — Z853 Personal history of malignant neoplasm of breast: Secondary | ICD-10-CM

## 2015-10-18 ENCOUNTER — Ambulatory Visit
Admission: RE | Admit: 2015-10-18 | Discharge: 2015-10-18 | Disposition: A | Discharge status: Home / Self Care | Payer: 59 | Source: Ambulatory Visit | Attending: Surgery | Admitting: Surgery

## 2015-10-18 DIAGNOSIS — Z9889 Other specified postprocedural states: Secondary | ICD-10-CM

## 2015-10-18 DIAGNOSIS — Z853 Personal history of malignant neoplasm of breast: Secondary | ICD-10-CM

## 2015-10-27 DIAGNOSIS — R011 Cardiac murmur, unspecified: Secondary | ICD-10-CM | POA: Insufficient documentation

## 2015-10-27 DIAGNOSIS — F17210 Nicotine dependence, cigarettes, uncomplicated: Secondary | ICD-10-CM | POA: Insufficient documentation

## 2015-11-16 ENCOUNTER — Other Ambulatory Visit: Payer: Self-pay | Admitting: Oncology

## 2015-11-26 ENCOUNTER — Telehealth: Payer: Self-pay | Admitting: Nurse Practitioner

## 2015-11-26 NOTE — Telephone Encounter (Signed)
Spoke w/ pt regarding LTS. Pt states she does not believe she needs it.

## 2016-02-05 ENCOUNTER — Other Ambulatory Visit: Payer: Self-pay | Admitting: Oncology

## 2016-05-29 ENCOUNTER — Ambulatory Visit (INDEPENDENT_AMBULATORY_CARE_PROVIDER_SITE_OTHER): Payer: 59 | Admitting: Family Medicine

## 2016-05-29 ENCOUNTER — Ambulatory Visit (INDEPENDENT_AMBULATORY_CARE_PROVIDER_SITE_OTHER): Payer: 59

## 2016-05-29 VITALS — BP 128/80 | HR 73 | Temp 98.3°F

## 2016-05-29 DIAGNOSIS — M25473 Effusion, unspecified ankle: Secondary | ICD-10-CM

## 2016-05-29 DIAGNOSIS — I809 Phlebitis and thrombophlebitis of unspecified site: Secondary | ICD-10-CM | POA: Diagnosis not present

## 2016-05-29 DIAGNOSIS — M79672 Pain in left foot: Secondary | ICD-10-CM | POA: Diagnosis not present

## 2016-05-29 DIAGNOSIS — R208 Other disturbances of skin sensation: Secondary | ICD-10-CM

## 2016-05-29 LAB — BASIC METABOLIC PANEL
BUN: 16 mg/dL (ref 7–25)
CHLORIDE: 105 mmol/L (ref 98–110)
CO2: 30 mmol/L (ref 20–31)
Calcium: 9.6 mg/dL (ref 8.6–10.4)
Creat: 0.68 mg/dL (ref 0.50–1.05)
Glucose, Bld: 94 mg/dL (ref 65–99)
Potassium: 4 mmol/L (ref 3.5–5.3)
SODIUM: 142 mmol/L (ref 135–146)

## 2016-05-29 LAB — GLUCOSE, POCT (MANUAL RESULT ENTRY): POC GLUCOSE: 89 mg/dL (ref 70–99)

## 2016-05-29 NOTE — Progress Notes (Signed)
By signing my name below, I, Mesha Guinyard, attest that this documentation has been prepared under the direction and in the presence of Merri Ray, MD.  Electronically Signed: Verlee Monte, Medical Scribe. 05/29/16. 11:20 AM.  Subjective:    Patient ID: Connie [name], female    DOB: 1961/04/29, 55 y.o.   MRN: [id number]  HPI Chief Complaint  Patient presents with  . Foot Pain    left foot for the past few months. Does not remember any injuries.     HPI Comments: Connie [name] is a 55 y.o. female who presents to the [facility] complaining of dorsal feet pain L>R pain onset the past 8 months. Pt reports associated symptoms of foot swelling. Pt experienced some SOB last night while she was on the airplane, and tingling in her toes that started last week while she vacationed in [place]- pt had been doing a lot of walking while there. Pt suspects her SOB was due to anxiety and she just wanted to be home. Pt broke a bone in her left foot and was treated by Dr. Gershon Mussel. Pt went to [facility] for plantar fascitis in the past and had a stress fracture in her left foot a year ago that's in a different area of the current pain. Pt deneies PMHx of DM. Pt denies recent injury to her foot, fevers, chills, and calf pain.  Patient Active Problem List   Diagnosis Date Noted  . Post-operative state 10/06/2013  . Breast cancer of upper-outer quadrant of left female breast ([place]) 08/21/2013  . Hepatitis C 11/22/2011  . Menopause 11/22/2011  . Psoriasis 11/22/2011   Past Medical History:  Diagnosis Date  . Cancer ([place])    breast  . 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 Hepatitis C    had the 12 week treatment-now gone  . PONV (postoperative nausea and vomiting)   . Psoriasis   . 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 Wears glasses    Past Surgical History:  Procedure Laterality Date  . BREAST LUMPECTOMY WITH NEEDLE LOCALIZATION Left 09/10/2013   Procedure: BREAST LUMPECTOMY WITH NEEDLE LOCALIZATION (two);  Surgeon: Joyice Faster. Cornett, MD;  Location: [facility];  Service: General;  Laterality: Left;  . BREAST LUMPECTOMY WITH NEEDLE LOCALIZATION Left 11/11/2013   Procedure: BREAST LUMPECTOMY WITH NEEDLE LOCALIZATION;  Surgeon: Marcello Moores A. Cornett, MD;  Location: [facility];  Service: General;  Laterality: Left;  . BREAST SURGERY     lumpectomy on left  . caesaran   [id number]   x2  . CESAREAN SECTION     2  . TUBAL LIGATION  2001   after last c-sec  . UPPER GI ENDOSCOPY     No Known Allergies Prior to Admission medications   Medication Sig Start Date End Date Taking? Authorizing Provider  omeprazole (PRILOSEC) 20 MG capsule Take 1 capsule (20 mg total) by mouth daily. 01/12/15  Yes Chauncey Cruel, MD  propranolol (INDERAL) 60 MG tablet Take 60 mg by mouth daily. Takes for esoph varies from having hep c-treated now   Yes Historical Provider, MD  escitalopram (LEXAPRO) 20 MG tablet Take 1 tablet (20 mg total) by mouth daily. 01/12/15   Chauncey Cruel, MD  gabapentin (NEURONTIN) 300 MG capsule Take 1 capsule (300 mg total) by mouth 3 (three) times daily. Patient not taking: Reported on 05/29/2016 01/12/15   Chauncey Cruel, MD  tamoxifen (NOLVADEX) 20 MG tablet Take 1 tablet by mouth  daily Patient not taking: Reported on 05/29/2016 02/07/16   Chauncey Cruel, MD  Ustekinumab 90 MG/ML SOSY Inject into the skin. 01/12/15   Chauncey Cruel, MD   Social History   Social History  . Marital status: Married    Spouse name: N/A  . Number of children: N/A  . Years of education: N/A   Occupational History  .  Building services engineer [employer]    Social History Main Topics  . Smoking status: Former Smoker    Packs/day: 0.50    Types: Cigarettes    Quit date: 08/30/2013  . Smokeless tobacco: Not on file  . Alcohol use No  . Drug use: No  . Sexual activity: Not on file      Comment: just recently quit   Other Topics Concern  . Not on file   Social History Narrative  . No narrative on file   Depression screen [facility] 2/9 05/29/2016  Decreased Interest 0  Down, Depressed, Hopeless 0  PHQ - 2 Score 0   Review of Systems  Constitutional: Negative for chills and fever.  Respiratory: Positive for shortness of breath.   Cardiovascular: Negative for leg swelling.  Musculoskeletal: Positive for arthralgias.       Pos foot swelling  Psychiatric/Behavioral: The patient is nervous/anxious (while on airplane).    Objective:  Physical Exam  Constitutional: She appears well-developed and well-nourished. No distress.  HENT:  Head: Normocephalic and atraumatic.  Eyes: Conjunctivae are normal.  Neck: Neck supple.  Cardiovascular: Normal rate.   Pulmonary/Chest: Effort normal.  Musculoskeletal:  Negative homans Left ankle there is some soft tissue swelling in the ankle- Ankle non tender Trace non pitting edema bitlaterally Tender on the dorsal aspect on her left foot With some warmth and tenderness over the dorsal mid foot along the veins of the mid foot 5th metatarsal and medicular non tender 3rd and 2nd metatarsal tender Calf non tender bilaterally  Neurological: She is alert.  Skin: Skin is warm and dry.  Psychiatric: She has a normal mood and affect. Her behavior is normal.  Nursing note and vitals reviewed.  BP 128/80   Pulse 73   Temp 98.3 F (36.8 C) (Oral)    Results for orders placed or performed in visit on 05/29/16  POCT glucose (manual entry)  Result Value Ref Range   POC Glucose 89 70 - 99 mg/dl   Dg Foot Complete Left  Result Date: 05/29/2016 CLINICAL DATA:  Left foot pain for 1 week with swelling. History of stress fracture. EXAM: LEFT FOOT - COMPLETE 3+ VIEW COMPARISON:  None. FINDINGS: There is no evidence of fracture or dislocation. There is no evidence of arthropathy or other focal bone abnormality. Soft tissues are unremarkable.  IMPRESSION: Negative. Electronically Signed   By: Ilona Sorrel M.D.   On: 05/29/2016 11:46   Assessment & Plan:  At the end of the visit pt asked about a medication to quit smoking. Pt has used Chantix in the past. Quit for 4 years but is a social smoker and doesn't smoke all the time Resources were given and will discuss next week.  Connie [name] is a 55 y.o. female Left foot pain - Plan: DG Foot Complete Left, Apply cam walker  Ankle swelling, unspecified laterality - Plan: Apply cam walker  Dysesthesia - Plan: TSH, POCT glucose (manual entry), Basic metabolic panel  Superficial  thrombophlebitis  For pain, swelling, overuse tendinitis/tubing anterior tendinitis versus superficial phlebitis. No signs of DVT, and denies calf pain or swelling. ER/RTC precautions discussed if these were to occur. Heat, anti-inflammatory, Cam Walker placed and recheck in the next 1 week  Tobacco abuse. Discussed at the end of visit, resources given, but will discuss further next visit as possible Chantix restart.  Check TSH, BMP for dysesthesias in the feet, follow-up in one week  No orders of the defined types were placed in this encounter.  Patient Instructions    There was no sign of fracture on your x-ray today, but this still could be due to a overuse injury of the foot or early stress injury. Wear the walking boot for the next 1 week until follow-up. Tylenol or Advil over-the-counter if needed, elevate your legs as much as possible in next few days to lessen the swelling, and recheck if any increased swelling.  The pain on top of the foot can also be a superficial thrombophlebitis, see information on this below. Heat, ibuprofen or Aleve over-the-counter and recheck in next 1 week, sooner if worse.  We'll check some blood tests for the tingling in your feet, but that may be due to the swelling. Again, recheck these symptoms in the next 1 week, sooner if worse.  Phlebitis Phlebitis is soreness and  swelling (inflammation) of a vein. This can occur in your arms, legs, or torso (trunk), as well as deeper inside your body. Phlebitis is usually not serious when it occurs close to the surface of the body. However, it can cause serious problems when it occurs in a vein deeper inside the body. CAUSES  Phlebitis can be triggered by various things, including:   Reduced blood flow through your veins. This can happen with:  Bed rest over a long period.  Long-distance travel.  Injury.  Surgery.  Being overweight (obese) or pregnant.  Having an IV tube put in the vein and getting certain medicines through the vein.  Cancer and cancer treatment.  Use of illegal drugs taken through the vein.  Inflammatory diseases.  Inherited (genetic) diseases that increase the risk of blood clots.  Hormone therapy, such as birth control pills. SIGNS AND SYMPTOMS   Red, tender, swollen, and painful area on your skin. Usually, the area will be long and narrow.  Firmness along the center of the affected area. This can indicate that a blood clot has formed.  Low-grade fever. DIAGNOSIS  A health care provider can usually diagnose phlebitis by examining the affected area and asking about your symptoms. To check for infection or blood clots, your health care provider may order blood tests or an ultrasound exam of the area. Blood tests and your family history may also indicate if you have an underlying genetic disease that causes blood clots. Occasionally, a piece of tissue is taken from the body (biopsy sample) if an unusual cause of phlebitis is suspected. TREATMENT  Treatment will vary depending on the severity of the condition and the area of the body affected. Treatment may include:  Use of a warm compress or heating pad.  Use of compression stockings or bandages.  Anti-inflammatory medicines.  Removal of any IV tube that may be causing the problem.  Medicines that kill germs (antibiotics) if an  infection is present.  Blood-thinning medicines if a blood clot is suspected or present.  In rare cases, surgery may be needed to remove damaged sections of vein. HOME CARE INSTRUCTIONS   Only take  over-the-counter or prescription medicines as directed by your health care provider. Take all medicines exactly as prescribed.  Raise (elevate) the affected area above the level of your heart as directed by your health care provider.  Apply a warm compress or heating pad to the affected area as directed by your health care provider. Do not sleep with the heating pad.  Use compression stockings or bandages as directed. These will speed healing and prevent the condition from coming back.  If you are on blood thinners:  Get follow-up blood tests as directed by your health care provider.  Check with your health care provider before using any new medicines.  Carry a medical alert card or wear your medical alert jewelry to show that you are on blood thinners.  For phlebitis in the legs:  Avoid prolonged standing or bed rest.  Keep your legs moving. Raise your legs when sitting or lying.  Do not smoke.  Women, particularly those over the age of 53, should consider the risks and benefits of taking the contraceptive pill. This kind of hormone treatment can increase your risk for blood clots.  Follow up with your health care provider as directed. SEEK MEDICAL CARE IF:   You have unusual bruising or any bleeding problems.  Your swelling or pain in the affected area is not improving.  You are on anti-inflammatory medicine, and you develop belly (abdominal) pain. SEEK IMMEDIATE MEDICAL CARE IF:   You have a sudden onset of chest pain or difficulty breathing.  You have a fever or persistent symptoms for more than 2-3 days.  You have a fever and your symptoms suddenly get worse. MAKE SURE YOU:  Understand these instructions.  Will watch your condition.  Will get help right away if  you are not doing well or get worse.   This information is not intended to replace advice given to you by your health care provider. Make sure you discuss any questions you have with your health care provider.   Document Released: 08/29/2001 Document Revised: 06/25/2013 Document Reviewed: 05/12/2013 Elsevier Interactive Patient Education 2016 [place] offers smoking cessation clinics. Registration is required. To register call [phone] or register online at [email or web address]  Smoking Cessation, Tips for Success If you are ready to quit smoking, congratulations! You have chosen to help yourself be healthier. Cigarettes bring nicotine, tar, carbon monoxide, and other irritants into your body. Your lungs, heart, and blood vessels will be able to work better without these poisons. There are many different ways to quit smoking. Nicotine gum, nicotine patches, a nicotine inhaler, or nicotine nasal spray can help with physical craving. Hypnosis, support groups, and medicines help break the habit of smoking. WHAT THINGS CAN I DO TO MAKE QUITTING EASIER?  Here are some tips to help you quit for good:  Pick a date when you will quit smoking completely. Tell all of your friends and family about your plan to quit on that date.  Do not try to slowly cut down on the number of cigarettes you are smoking. Pick a quit date and quit smoking completely starting on that day.  Throw away all cigarettes.   Clean and remove all ashtrays from your home, work, and car.  On a card, write down your reasons for quitting. Carry the card with you and read it when you get the urge to smoke.  Cleanse your body of nicotine. Drink enough water and fluids to keep your urine clear or pale yellow. Do this  after quitting to flush the nicotine from your body.  Learn to predict your moods. Do not let a bad situation be your excuse to have a cigarette. Some situations in your life might tempt you into wanting  a cigarette.  Never have "just one" cigarette. It leads to wanting another and another. Remind yourself of your decision to quit.  Change habits associated with smoking. If you smoked while driving or when feeling stressed, try other activities to replace smoking. Stand up when drinking your coffee. Brush your teeth after eating. Sit in a different chair when you read the paper. Avoid alcohol while trying to quit, and try to drink fewer caffeinated beverages. Alcohol and caffeine may urge you to smoke.  Avoid foods and drinks that can trigger a desire to smoke, such as sugary or spicy foods and alcohol.  Ask people who smoke not to smoke around you.  Have something planned to do right after eating or having a cup of coffee. For example, plan to take a walk or exercise.  Try a relaxation exercise to calm you down and decrease your stress. Remember, you may be tense and nervous for the first 2 weeks after you quit, but this will pass.  Find new activities to keep your hands busy. Play with a pen, coin, or rubber band. Doodle or draw things on paper.  Brush your teeth right after eating. This will help cut down on the craving for the taste of tobacco after meals. You can also try mouthwash.   Use oral substitutes in place of cigarettes. Try using lemon drops, carrots, cinnamon sticks, or chewing gum. Keep them handy so they are available when you have the urge to smoke.  When you have the urge to smoke, try deep breathing.  Designate your home as a nonsmoking area.  If you are a heavy smoker, ask your health care provider about a prescription for nicotine chewing gum. It can ease your withdrawal from nicotine.  Reward yourself. Set aside the cigarette money you save and buy yourself something nice.  Look for support from others. Join a support group or smoking cessation program. Ask someone at home or at work to help you with your plan to quit smoking.  Always ask yourself, "Do I need  this cigarette or is this just a reflex?" Tell yourself, "Today, I choose not to smoke," or "I do not want to smoke." You are reminding yourself of your decision to quit.  Do not replace cigarette smoking with electronic cigarettes (commonly called e-cigarettes). The safety of e-cigarettes is unknown, and some may contain harmful chemicals.  If you relapse, do not give up! Plan ahead and think about what you will do the next time you get the urge to smoke. HOW WILL I FEEL WHEN I QUIT SMOKING? You may have symptoms of withdrawal because your body is used to nicotine (the addictive substance in cigarettes). You may crave cigarettes, be irritable, feel very hungry, cough often, get headaches, or have difficulty concentrating. The withdrawal symptoms are only temporary. They are strongest when you first quit but will go away within 10-14 days. When withdrawal symptoms occur, stay in control. Think about your reasons for quitting. Remind yourself that these are signs that your body is healing and getting used to being without cigarettes. Remember that withdrawal symptoms are easier to treat than the major diseases that smoking can cause.  Even after the withdrawal is over, expect periodic urges to smoke. However, these cravings are generally  short lived and will go away whether you smoke or not. Do not smoke! WHAT RESOURCES ARE AVAILABLE TO HELP ME QUIT SMOKING? Your health care provider can direct you to community resources or hospitals for support, which may include:  Group support.  Education.  Hypnosis.  Therapy.   This information is not intended to replace advice given to you by your health care provider. Make sure you discuss any questions you have with your health care provider.   Document Released: 06/02/2004 Document Revised: 09/25/2014 Document Reviewed: 02/20/2013 Elsevier Interactive Patient Education 2016 [employer].    IF you received an x-ray today, you will receive an invoice  from [facility]. Please contact [facility] at [phone] with questions or concerns regarding your invoice.   IF you received labwork today, you will receive an invoice from [employer]. Please contact [facility] at [phone] with questions or concerns regarding your invoice.   Our billing staff will not be able to assist you with questions regarding bills from these companies.  You will be contacted with the lab results as soon as they are available. The fastest way to get your results is to activate your My Chart account. Instructions are located on the last page of this paperwork. If you have not heard from [place] regarding the results in 2 weeks, please contact this office.        I personally performed the services described in this documentation, which was scribed in my presence. The recorded information has been reviewed and considered, and addended by me as needed.   Signed,   Merri Ray, MD [facility].  05/31/16 4:26 PM

## 2016-05-29 NOTE — Patient Instructions (Addendum)
There was no sign of fracture on your x-ray today, but this still could be due to a overuse injury of the foot or early stress injury. Wear the walking boot for the next 1 week until follow-up. Tylenol or Advil over-the-counter if needed, elevate your legs as much as possible in next few days to lessen the swelling, and recheck if any increased swelling.  The pain on top of the foot can also be a superficial thrombophlebitis, see information on this below. Heat, ibuprofen or Aleve over-the-counter and recheck in next 1 week, sooner if worse.  We'll check some blood tests for the tingling in your feet, but that may be due to the swelling. Again, recheck these symptoms in the next 1 week, sooner if worse.  Phlebitis Phlebitis is soreness and swelling (inflammation) of a vein. This can occur in your arms, legs, or torso (trunk), as well as deeper inside your body. Phlebitis is usually not serious when it occurs close to the surface of the body. However, it can cause serious problems when it occurs in a vein deeper inside the body. CAUSES  Phlebitis can be triggered by various things, including:   Reduced blood flow through your veins. This can happen with:  Bed rest over a long period.  Long-distance travel.  Injury.  Surgery.  Being overweight (obese) or pregnant.  Having an IV tube put in the vein and getting certain medicines through the vein.  Cancer and cancer treatment.  Use of illegal drugs taken through the vein.  Inflammatory diseases.  Inherited (genetic) diseases that increase the risk of blood clots.  Hormone therapy, such as birth control pills. SIGNS AND SYMPTOMS   Red, tender, swollen, and painful area on your skin. Usually, the area will be long and narrow.  Firmness along the center of the affected area. This can indicate that a blood clot has formed.  Low-grade fever. DIAGNOSIS  A health care provider can usually diagnose phlebitis by examining the affected  area and asking about your symptoms. To check for infection or blood clots, your health care provider may order blood tests or an ultrasound exam of the area. Blood tests and your family history may also indicate if you have an underlying genetic disease that causes blood clots. Occasionally, a piece of tissue is taken from the body (biopsy sample) if an unusual cause of phlebitis is suspected. TREATMENT  Treatment will vary depending on the severity of the condition and the area of the body affected. Treatment may include:  Use of a warm compress or heating pad.  Use of compression stockings or bandages.  Anti-inflammatory medicines.  Removal of any IV tube that may be causing the problem.  Medicines that kill germs (antibiotics) if an infection is present.  Blood-thinning medicines if a blood clot is suspected or present.  In rare cases, surgery may be needed to remove damaged sections of vein. HOME CARE INSTRUCTIONS   Only take over-the-counter or prescription medicines as directed by your health care provider. Take all medicines exactly as prescribed.  Raise (elevate) the affected area above the level of your heart as directed by your health care provider.  Apply a warm compress or heating pad to the affected area as directed by your health care provider. Do not sleep with the heating pad.  Use compression stockings or bandages as directed. These will speed healing and prevent the condition from coming back.  If you are on blood thinners:  Get follow-up blood tests as directed  by your health care provider.  Check with your health care provider before using any new medicines.  Carry a medical alert card or wear your medical alert jewelry to show that you are on blood thinners.  For phlebitis in the legs:  Avoid prolonged standing or bed rest.  Keep your legs moving. Raise your legs when sitting or lying.  Do not smoke.  Women, particularly those over the age of 32,  should consider the risks and benefits of taking the contraceptive pill. This kind of hormone treatment can increase your risk for blood clots.  Follow up with your health care provider as directed. SEEK MEDICAL CARE IF:   You have unusual bruising or any bleeding problems.  Your swelling or pain in the affected area is not improving.  You are on anti-inflammatory medicine, and you develop belly (abdominal) pain. SEEK IMMEDIATE MEDICAL CARE IF:   You have a sudden onset of chest pain or difficulty breathing.  You have a fever or persistent symptoms for more than 2-3 days.  You have a fever and your symptoms suddenly get worse. MAKE SURE YOU:  Understand these instructions.  Will watch your condition.  Will get help right away if you are not doing well or get worse.   This information is not intended to replace advice given to you by your health care provider. Make sure you discuss any questions you have with your health care provider.   Document Released: 08/29/2001 Document Revised: 06/25/2013 Document Reviewed: 05/12/2013 Elsevier Interactive Patient Education 2016 [place] offers smoking cessation clinics. Registration is required. To register call [phone] or register online at [email or web address]  Smoking Cessation, Tips for Success If you are ready to quit smoking, congratulations! You have chosen to help yourself be healthier. Cigarettes bring nicotine, tar, carbon monoxide, and other irritants into your body. Your lungs, heart, and blood vessels will be able to work better without these poisons. There are many different ways to quit smoking. Nicotine gum, nicotine patches, a nicotine inhaler, or nicotine nasal spray can help with physical craving. Hypnosis, support groups, and medicines help break the habit of smoking. WHAT THINGS CAN I DO TO MAKE QUITTING EASIER?  Here are some tips to help you quit for good:  Pick a date when you will quit smoking  completely. Tell all of your friends and family about your plan to quit on that date.  Do not try to slowly cut down on the number of cigarettes you are smoking. Pick a quit date and quit smoking completely starting on that day.  Throw away all cigarettes.   Clean and remove all ashtrays from your home, work, and car.  On a card, write down your reasons for quitting. Carry the card with you and read it when you get the urge to smoke.  Cleanse your body of nicotine. Drink enough water and fluids to keep your urine clear or pale yellow. Do this after quitting to flush the nicotine from your body.  Learn to predict your moods. Do not let a bad situation be your excuse to have a cigarette. Some situations in your life might tempt you into wanting a cigarette.  Never have "just one" cigarette. It leads to wanting another and another. Remind yourself of your decision to quit.  Change habits associated with smoking. If you smoked while driving or when feeling stressed, try other activities to replace smoking. Stand up when drinking your coffee. Brush your teeth after eating. Sit in  a different chair when you read the paper. Avoid alcohol while trying to quit, and try to drink fewer caffeinated beverages. Alcohol and caffeine may urge you to smoke.  Avoid foods and drinks that can trigger a desire to smoke, such as sugary or spicy foods and alcohol.  Ask people who smoke not to smoke around you.  Have something planned to do right after eating or having a cup of coffee. For example, plan to take a walk or exercise.  Try a relaxation exercise to calm you down and decrease your stress. Remember, you may be tense and nervous for the first 2 weeks after you quit, but this will pass.  Find new activities to keep your hands busy. Play with a pen, coin, or rubber band. Doodle or draw things on paper.  Brush your teeth right after eating. This will help cut down on the craving for the taste of tobacco  after meals. You can also try mouthwash.   Use oral substitutes in place of cigarettes. Try using lemon drops, carrots, cinnamon sticks, or chewing gum. Keep them handy so they are available when you have the urge to smoke.  When you have the urge to smoke, try deep breathing.  Designate your home as a nonsmoking area.  If you are a heavy smoker, ask your health care provider about a prescription for nicotine chewing gum. It can ease your withdrawal from nicotine.  Reward yourself. Set aside the cigarette money you save and buy yourself something nice.  Look for support from others. Join a support group or smoking cessation program. Ask someone at home or at work to help you with your plan to quit smoking.  Always ask yourself, "Do I need this cigarette or is this just a reflex?" Tell yourself, "Today, I choose not to smoke," or "I do not want to smoke." You are reminding yourself of your decision to quit.  Do not replace cigarette smoking with electronic cigarettes (commonly called e-cigarettes). The safety of e-cigarettes is unknown, and some may contain harmful chemicals.  If you relapse, do not give up! Plan ahead and think about what you will do the next time you get the urge to smoke. HOW WILL I FEEL WHEN I QUIT SMOKING? You may have symptoms of withdrawal because your body is used to nicotine (the addictive substance in cigarettes). You may crave cigarettes, be irritable, feel very hungry, cough often, get headaches, or have difficulty concentrating. The withdrawal symptoms are only temporary. They are strongest when you first quit but will go away within 10-14 days. When withdrawal symptoms occur, stay in control. Think about your reasons for quitting. Remind yourself that these are signs that your body is healing and getting used to being without cigarettes. Remember that withdrawal symptoms are easier to treat than the major diseases that smoking can cause.  Even after the withdrawal  is over, expect periodic urges to smoke. However, these cravings are generally short lived and will go away whether you smoke or not. Do not smoke! WHAT RESOURCES ARE AVAILABLE TO HELP ME QUIT SMOKING? Your health care provider can direct you to community resources or hospitals for support, which may include:  Group support.  Education.  Hypnosis.  Therapy.   This information is not intended to replace advice given to you by your health care provider. Make sure you discuss any questions you have with your health care provider.   Document Released: 06/02/2004 Document Revised: 09/25/2014 Document Reviewed: 02/20/2013 Elsevier Interactive Patient  Education 2016 [employer].    IF you received an x-ray today, you will receive an invoice from [facility]. Please contact [facility] at [phone] with questions or concerns regarding your invoice.   IF you received labwork today, you will receive an invoice from [employer]. Please contact [facility] at [phone] with questions or concerns regarding your invoice.   Our billing staff will not be able to assist you with questions regarding bills from these companies.  You will be contacted with the lab results as soon as they are available. The fastest way to get your results is to activate your My Chart account. Instructions are located on the last page of this paperwork. If you have not heard from [place] regarding the results in 2 weeks, please contact this office.

## 2016-05-30 LAB — TSH: TSH: 1.29 m[IU]/L

## 2016-08-07 ENCOUNTER — Ambulatory Visit (INDEPENDENT_AMBULATORY_CARE_PROVIDER_SITE_OTHER): Payer: Managed Care, Other (non HMO) | Admitting: Physician Assistant

## 2016-08-07 VITALS — BP 122/72 | HR 90 | Temp 99.4°F | Resp 17 | Ht 66.5 in | Wt 201.0 lb

## 2016-08-07 DIAGNOSIS — R0981 Nasal congestion: Secondary | ICD-10-CM | POA: Diagnosis not present

## 2016-08-07 DIAGNOSIS — J069 Acute upper respiratory infection, unspecified: Secondary | ICD-10-CM | POA: Diagnosis not present

## 2016-08-07 DIAGNOSIS — R509 Fever, unspecified: Secondary | ICD-10-CM

## 2016-08-07 DIAGNOSIS — J029 Acute pharyngitis, unspecified: Secondary | ICD-10-CM | POA: Diagnosis not present

## 2016-08-07 DIAGNOSIS — R05 Cough: Secondary | ICD-10-CM

## 2016-08-07 DIAGNOSIS — R059 Cough, unspecified: Secondary | ICD-10-CM

## 2016-08-07 LAB — POCT INFLUENZA A/B
INFLUENZA B, POC: NEGATIVE
Influenza A, POC: NEGATIVE

## 2016-08-07 LAB — POCT RAPID STREP A (OFFICE): Rapid Strep A Screen: NEGATIVE

## 2016-08-07 MED ORDER — BENZONATATE 100 MG PO CAPS: 100.0000 mg | ORAL_CAPSULE | Freq: Three times a day (TID) | ORAL | 0 refills | Status: DC | PRN

## 2016-08-07 MED ORDER — PSEUDOEPHEDRINE HCL ER 120 MG PO TB12: 120.0000 mg | ORAL_TABLET | Freq: Two times a day (BID) | ORAL | 0 refills | Status: DC

## 2016-08-07 MED ORDER — HYDROCOD POLST-CPM POLST ER 10-8 MG/5ML PO SUER: 5.0000 mL | Freq: Two times a day (BID) | ORAL | 0 refills | Status: DC | PRN

## 2016-08-07 MED ORDER — FLUTICASONE PROPIONATE 50 MCG/ACT NA SUSP: 2.0000 | Freq: Every day | NASAL | 0 refills | Status: AC

## 2016-08-07 NOTE — Patient Instructions (Addendum)
-   We will treat this as a respiratory viral infection.  - I recommend you rest, drink plenty of fluids, eat light meals including soups.  - You may use cough syrup at night for your cough and sore throat, Tessalon pearls during the day. Be aware that cough syrup can definitely make you drowsy and sleepy so do not drive or operate any heavy machinery if it is affecting you during the day.  - You may also use Tylenol or ibuprofen over-the-counter for your sore throat.  - Use sudaphed for mucous production. Use flonase for stuffy nose.  - Please let me know if you are not seeing any improvement or get worse in 5 days.     IF you received an x-ray today, you will receive an invoice from [facility]. Please contact [facility] at [phone] with questions or concerns regarding your invoice.   IF you received labwork today, you will receive an invoice from [employer]. Please contact [facility] at [phone] with questions or concerns regarding your invoice.   Our billing staff will not be able to assist you with questions regarding bills from these companies.  You will be contacted with the lab results as soon as they are available. The fastest way to get your results is to activate your My Chart account. Instructions are located on the last page of this paperwork. If you have not heard from [place] regarding the results in 2 weeks, please contact this office.

## 2016-08-07 NOTE — Progress Notes (Signed)
MRN: [id number] DOB: 1961-06-17  Subjective:   Connie [name] is a 55 y.o. female presenting for chief complaint of Cough; URI; and Fatigue . Reports two day  history of Fever (100 yesterday), sinus congestion, sneezing, rhinorrhea, ear fullness, sore throat, productive cough that started today (no hemoptysis) and shortness of breath, chills, fatigue and malaise. Has tried OTC mucinex and vicks sinsus and congestion with no relief. Denies  wheezing, shortness of breath, chest tightness, chest pain and myalgia, night sweats, decreased appetite, weight loss, nausea, vomiting, abdominal pain and diarrhea. Has had sick contact with coworkers. Denies history of seasonal allergies or history of asthma. Patient has had flu shot this season. Denies smoking. Quit 3 months ago. She is a former smoker and smoked 0.5ppd x 10 years.  Denies any other aggravating or relieving factors, no other questions or concerns. Has no history of pneumonia or sinus infections.   Connie [name] has a current medication list which includes the following prescription(s): bupropion, omeprazole, propranolol, escitalopram, and stelara. Also has No Known Allergies.  Connie [name]  has a past medical history of Cancer ([place]); Esophageal varices ([place]); Hepatitis C; PONV (postoperative nausea and vomiting); Psoriasis; Radiation (12/24/13-02/11/14); and Wears glasses. Also  has a past surgical history that includes caesaran  [id number]); Upper gi endoscopy; Tubal ligation (2001); Breast lumpectomy with needle localization (Left, 09/10/2013); Cesarean section; Breast surgery; and Breast lumpectomy with needle localization (Left, 11/11/2013).   Objective:   Vitals: BP 122/72 (BP Location: Right Arm, Patient Position: Sitting, Cuff Size: Normal)   Pulse 90   Temp 99.4 F (37.4 C) (Oral)   Resp 17   Ht 5' 6.5" (1.689 m)   Wt 201 lb (91.2 kg)   SpO2 95%   BMI 31.96 kg/m   Physical Exam  Constitutional: She is oriented to person, place, and time. She  appears well-developed and well-nourished. No distress (mild).  HENT:  Head: Normocephalic and atraumatic.  Right Ear: External ear and ear canal normal. A middle ear effusion is present.  Left Ear: External ear and ear canal normal. A middle ear effusion is present.  Nose: Mucosal edema and rhinorrhea present. Right sinus exhibits no maxillary sinus tenderness and no frontal sinus tenderness. Left sinus exhibits no maxillary sinus tenderness and no frontal sinus tenderness.  Mouth/Throat: Mucous membranes are normal. Posterior oropharyngeal erythema present.  Petechiae noted on soft palate  Eyes: Conjunctivae are normal.  Neck: Normal range of motion.  Pulmonary/Chest: Effort normal and breath sounds normal.  Lymphadenopathy:       Head (right side): No submental, no submandibular, no tonsillar, no preauricular, no posterior auricular and no occipital adenopathy present.       Head (left side): No submental, no submandibular, no tonsillar, no preauricular, no posterior auricular and no occipital adenopathy present.    She has no cervical adenopathy.       Right: No supraclavicular adenopathy present.       Left: No supraclavicular adenopathy present.  Tender to palpation near tonsillar lymph nodes   Neurological: She is alert and oriented to person, place, and time.  Skin: Skin is warm and dry.  Psychiatric: She has a normal mood and affect.  Vitals reviewed.   Results for orders placed or performed in visit on 08/07/16 (from the past 24 hour(s))  POCT rapid strep A     Status: None   Collection Time: 08/07/16 10:46 AM  Result Value Ref Range   Rapid Strep A Screen Negative Negative  POCT Influenza  A/B     Status: None   Collection Time: 08/07/16 10:55 AM  Result Value Ref Range   Influenza A, POC Negative Negative   Influenza B, POC Negative Negative    Assessment and Plan :  1. Fever, unspecified fever cause - POCT Influenza A/B  2. Sore throat - POCT rapid strep A -  Culture, Group A Strep  3. Nasal congestion - fluticasone (FLONASE) 50 MCG/ACT nasal spray; Place 2 sprays into both nostrils daily.  Dispense: 16 g; Refill: 0 - pseudoephedrine (SUDAFED 12 HOUR) 120 MG 12 hr tablet; Take 1 tablet (120 mg total) by mouth 2 (two) times daily.  Dispense: 14 tablet; Refill: 0  4. Cough - benzonatate (TESSALON) 100 MG capsule; Take 1-2 capsules (100-200 mg total) by mouth 3 (three) times daily as needed for cough.  Dispense: 40 capsule; Refill: 0 - chlorpheniramine-HYDROcodone (TUSSIONEX PENNKINETIC ER) 10-8 MG/5ML SUER; Take 5 mLs by mouth every 12 (twelve) hours as needed for cough.  Dispense: 100 mL; Refill: 0  5. Acute upper respiratory infection -Likely viral etiology. Will treat symptomatically. Pt instructed to follow up if she is not having any improvement in 5 days. If symptoms worsen, seek care sooner.    Tenna Delaine, PA-C  [facility] 08/07/2016 10:58 AM

## 2016-08-08 LAB — CULTURE, GROUP A STREP: ORGANISM ID, BACTERIA: NORMAL

## 2016-09-13 ENCOUNTER — Encounter (HOSPITAL_COMMUNITY): Payer: Self-pay

## 2016-09-13 ENCOUNTER — Emergency Department (HOSPITAL_COMMUNITY): Payer: Managed Care, Other (non HMO)

## 2016-09-13 ENCOUNTER — Emergency Department (HOSPITAL_COMMUNITY)
Admission: EM | Admit: 2016-09-13 | Discharge: 2016-09-13 | Disposition: A | Discharge status: Home / Self Care | Payer: Managed Care, Other (non HMO) | Attending: Emergency Medicine | Admitting: Emergency Medicine

## 2016-09-13 DIAGNOSIS — R109 Unspecified abdominal pain: Secondary | ICD-10-CM | POA: Insufficient documentation

## 2016-09-13 DIAGNOSIS — Z853 Personal history of malignant neoplasm of breast: Secondary | ICD-10-CM | POA: Insufficient documentation

## 2016-09-13 DIAGNOSIS — Z87891 Personal history of nicotine dependence: Secondary | ICD-10-CM | POA: Insufficient documentation

## 2016-09-13 LAB — URINALYSIS, ROUTINE W REFLEX MICROSCOPIC
BILIRUBIN URINE: NEGATIVE
GLUCOSE, UA: NEGATIVE mg/dL
HGB URINE DIPSTICK: NEGATIVE
Ketones, ur: NEGATIVE mg/dL
Nitrite: NEGATIVE
PH: 5 (ref 5.0–8.0)
Protein, ur: NEGATIVE mg/dL
SPECIFIC GRAVITY, URINE: 1.024 (ref 1.005–1.030)

## 2016-09-13 LAB — COMPREHENSIVE METABOLIC PANEL
ALK PHOS: 70 U/L (ref 38–126)
ALT: 19 U/L (ref 14–54)
ANION GAP: 10 (ref 5–15)
AST: 20 U/L (ref 15–41)
Albumin: 4.1 g/dL (ref 3.5–5.0)
BILIRUBIN TOTAL: 0.6 mg/dL (ref 0.3–1.2)
BUN: 18 mg/dL (ref 6–20)
CALCIUM: 9 mg/dL (ref 8.9–10.3)
CO2: 23 mmol/L (ref 22–32)
CREATININE: 0.79 mg/dL (ref 0.44–1.00)
Chloride: 107 mmol/L (ref 101–111)
Glucose, Bld: 107 mg/dL — ABNORMAL HIGH (ref 65–99)
Potassium: 3.8 mmol/L (ref 3.5–5.1)
SODIUM: 140 mmol/L (ref 135–145)
TOTAL PROTEIN: 7.6 g/dL (ref 6.5–8.1)

## 2016-09-13 LAB — CBC WITH DIFFERENTIAL/PLATELET
BASOS ABS: 0 10*3/uL (ref 0.0–0.1)
BASOS PCT: 0 %
EOS ABS: 0.3 10*3/uL (ref 0.0–0.7)
Eosinophils Relative: 3 %
HEMATOCRIT: 40.3 % (ref 36.0–46.0)
HEMOGLOBIN: 13.5 g/dL (ref 12.0–15.0)
Lymphocytes Relative: 19 %
Lymphs Abs: 1.9 10*3/uL (ref 0.7–4.0)
MCH: 29.9 pg (ref 26.0–34.0)
MCHC: 33.5 g/dL (ref 30.0–36.0)
MCV: 89.4 fL (ref 78.0–100.0)
Monocytes Absolute: 1 10*3/uL (ref 0.1–1.0)
Monocytes Relative: 10 %
NEUTROS ABS: 6.8 10*3/uL (ref 1.7–7.7)
NEUTROS PCT: 68 %
Platelets: 217 10*3/uL (ref 150–400)
RBC: 4.51 MIL/uL (ref 3.87–5.11)
RDW: 13.1 % (ref 11.5–15.5)
WBC: 10 10*3/uL (ref 4.0–10.5)

## 2016-09-13 MED ORDER — METHOCARBAMOL 500 MG PO TABS: 500.0000 mg | ORAL_TABLET | Freq: Three times a day (TID) | ORAL | 0 refills | Status: DC | PRN

## 2016-09-13 NOTE — Discharge Instructions (Signed)
Urine culture is pending. You will be informed if it is positive.

## 2016-09-13 NOTE — ED Provider Notes (Signed)
[facility] DEPT Provider Note   CSN: [id number] Arrival date & time: 09/13/16  [id number]     History   Chief Complaint Chief Complaint  Patient presents with  . Flank Pain    HPI Connie [name] is a 55 y.o. female.  HPI Patient presents with right flank pain. Began 2-3 days ago. Worse with certain positions. Worse with breathing although she does not feel short of breath. Some urinary frequency although no dysuria or hematuria. No trauma. No fall. No diarrhea or constipation. Past Medical History:  Diagnosis Date  . Cancer (Early)    breast  . 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 Hepatitis C    had the 12 week treatment-now gone  . PONV (postoperative nausea and vomiting)   . Psoriasis   . 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 Wears glasses     Patient Active Problem List   Diagnosis Date Noted  . Cigarette smoker 10/27/2015  . Murmur 10/27/2015  . Post-operative state 10/06/2013  . Breast cancer of upper-outer quadrant of left female breast ([place]) 08/21/2013  . Cirrhosis of liver ([place]) 08/06/2012  . Chronic hepatitis C ([place]) 04/09/2012  . Hepatitis C 11/22/2011  . Menopause 11/22/2011  . Psoriasis 11/22/2011    Past Surgical History:  Procedure Laterality Date  . BREAST LUMPECTOMY WITH NEEDLE LOCALIZATION Left 09/10/2013   Procedure: BREAST LUMPECTOMY WITH NEEDLE LOCALIZATION (two);  Surgeon: Joyice Faster. Cornett, MD;  Location: [facility];  Service: General;  Laterality: Left;  . BREAST LUMPECTOMY WITH NEEDLE LOCALIZATION Left 11/11/2013   Procedure: BREAST LUMPECTOMY WITH NEEDLE LOCALIZATION;  Surgeon: Marcello Moores A. Cornett, MD;  Location: [facility];  Service: General;  Laterality: Left;  . BREAST SURGERY     lumpectomy on left  . caesaran   [id number]   x2  . CESAREAN SECTION     2  . TUBAL LIGATION  2001   after last c-sec  . UPPER GI ENDOSCOPY      OB History    Gravida Para Term Preterm AB Living   3 2      1      SAB TAB Ectopic Multiple Live Births                  Obstetric Comments   Menarche age 39, first live birth age 45, the patient is GX P2. Last menstrual period was approximately 2008.  BC x 15 years.  Had Shot x 5-6 years.  She did not take hormone replacement.       Home Medications    Prior to Admission medications   Medication Sig Start Date End Date Taking? Authorizing Provider  benzonatate (TESSALON) 100 MG capsule Take 1-2 capsules (100-200 mg total) by mouth 3 (three) times daily as needed for cough. 08/07/16   Leonie Douglas, PA-C  buPROPion (WELLBUTRIN SR) 100 MG 12 hr tablet Take 100 mg by mouth 2 (two) times daily.    Historical Provider, MD  chlorpheniramine-HYDROcodone (TUSSIONEX PENNKINETIC ER) 10-8 MG/5ML SUER Take 5 mLs by mouth every 12 (twelve) hours as needed for cough. 08/07/16   Leonie Douglas, PA-C  escitalopram (LEXAPRO) 20 MG tablet Take 1 tablet (20 mg total) by mouth daily. 01/12/15   Chauncey Cruel, MD  fluticasone (FLONASE) 50 MCG/ACT nasal spray Place 2 sprays into both nostrils daily. 08/07/16   Leonie Douglas, PA-C  methocarbamol (ROBAXIN) 500 MG tablet Take 1 tablet (500  mg total) by mouth every 8 (eight) hours as needed for muscle spasms. 09/13/16   Davonna Belling, MD  omeprazole (PRILOSEC) 20 MG capsule Take 1 capsule (20 mg total) by mouth daily. 01/12/15   Chauncey Cruel, MD  propranolol (INDERAL) 60 MG tablet Take 60 mg by mouth daily. Takes for esoph varies from having hep c-treated now    Historical Provider, MD  pseudoephedrine (SUDAFED 12 HOUR) 120 MG 12 hr tablet Take 1 tablet (120 mg total) by mouth 2 (two) times daily. 08/07/16   Leonie Douglas, PA-C  STELARA 45 MG/0.5ML SOSY injection  08/02/16   Historical Provider, MD    Family History Family History  Problem Relation Age of Onset  . Heart attack Father 30    Deceased  . Heart disease Father   . Congestive Heart Failure Mother 11    deceased  . Heart disease  Mother     Social History Social History  Substance Use Topics  . Smoking status: Former Smoker    Packs/day: 0.50    Types: Cigarettes    Quit date: 08/30/2013  . Smokeless tobacco: Never Used  . Alcohol use No     Allergies   Patient has no known allergies.   Review of Systems Review of Systems  Constitutional: Negative for appetite change.  HENT: Negative for dental problem.   Respiratory: Negative for cough and shortness of breath.   Cardiovascular: Positive for chest pain.  Gastrointestinal: Negative for nausea.  Genitourinary: Positive for flank pain and frequency. Negative for difficulty urinating and hematuria.  Musculoskeletal: Negative for back pain.  Neurological: Negative for numbness.  Hematological: Negative for adenopathy.     Physical Exam Updated Vital Signs BP 113/79   Pulse 70   Temp 98.2 F (36.8 C) (Oral)   Resp 18   Ht 5\' 6"  (1.676 m)   Wt 180 lb (81.6 kg)   SpO2 97%   BMI 29.05 kg/m   Physical Exam  Constitutional: She appears well-developed.  HENT:  Head: Atraumatic.  Eyes: EOM are normal.  Neck: Neck supple.  Cardiovascular: Normal rate.   Pulmonary/Chest: She has no wheezes. She has no rales. She exhibits tenderness.  Tenderness over right lateral very low ribs. No rash. No crepitance or deformity.  Abdominal: There is no tenderness.  Musculoskeletal: She exhibits no edema.  Neurological: She is alert.  Skin: Skin is warm. Capillary refill takes less than 2 seconds.  Psychiatric: She has a normal mood and affect.     ED Treatments / Results  Labs (all labs ordered are listed, but only abnormal results are displayed) Labs Reviewed  URINALYSIS, ROUTINE W REFLEX MICROSCOPIC - Abnormal; Notable for the following:       Result Value   Color, Urine AMBER (*)    APPearance CLOUDY (*)    Leukocytes, UA MODERATE (*)    Bacteria, UA FEW (*)    Squamous Epithelial / LPF TOO NUMEROUS TO COUNT (*)    Non Squamous Epithelial 0-5  (*)    All other components within normal limits  COMPREHENSIVE METABOLIC PANEL - Abnormal; Notable for the following:    Glucose, Bld 107 (*)    All other components within normal limits  URINE CULTURE  CBC WITH DIFFERENTIAL/PLATELET    EKG  EKG Interpretation None       Radiology Dg Chest 2 View  Result Date: 09/13/2016 CLINICAL DATA:  Flank pain.  Chest wall pain . EXAM: CHEST  2 VIEW COMPARISON:  06/19/2016 . FINDINGS: Mediastinum and hilar structures normal. Heart size normal. Low lung volumes with basilar atelectasis. No pleural effusion or pneumothorax. Surgical clips noted over the left chest. IMPRESSION: Low lung volumes with mild bibasilar atelectasis. Electronically Signed   By: Marcello Moores  Register   On: 09/13/2016 07:55    Procedures Procedures (including critical care time)  Medications Ordered in ED Medications - No data to display   Initial Impression / Assessment and Plan / ED Course  I have reviewed the triage vital signs and the nursing notes.  Pertinent labs & imaging results that were available during my care of the patient were reviewed by me and considered in my medical decision making (see chart for details).  Clinical Course     Patient with flank pain. No fevers or dysuria. Somewhat worse with movement. Lab work reassuring. Urine does have some leukocyte white cells and few bacteria however no dysuria and is somewhat contaminated. Culture sent. Will not empirically treat. Discussed with patient about results. Will discharge home with muscle relaxers at this time.  Final Clinical Impressions(s) / ED Diagnoses   Final diagnoses:  Flank pain    New Prescriptions New Prescriptions   METHOCARBAMOL (ROBAXIN) 500 MG TABLET    Take 1 tablet (500 mg total) by mouth every 8 (eight) hours as needed for muscle spasms.     Davonna Belling, MD 09/13/16 [phone]

## 2016-09-14 LAB — URINE CULTURE

## 2016-10-27 ENCOUNTER — Other Ambulatory Visit: Payer: Self-pay | Admitting: Family

## 2016-10-27 DIAGNOSIS — Z1231 Encounter for screening mammogram for malignant neoplasm of breast: Secondary | ICD-10-CM

## 2016-10-27 DIAGNOSIS — Z853 Personal history of malignant neoplasm of breast: Secondary | ICD-10-CM

## 2016-11-06 ENCOUNTER — Ambulatory Visit
Admission: RE | Admit: 2016-11-06 | Discharge: 2016-11-06 | Disposition: A | Discharge status: Home / Self Care | Payer: Managed Care, Other (non HMO) | Source: Ambulatory Visit | Attending: Family | Admitting: Family

## 2016-11-06 DIAGNOSIS — Z1231 Encounter for screening mammogram for malignant neoplasm of breast: Secondary | ICD-10-CM

## 2016-11-06 DIAGNOSIS — Z853 Personal history of malignant neoplasm of breast: Secondary | ICD-10-CM

## 2016-11-06 HISTORY — DX: Personal history of irradiation: Z92.3

## 2017-05-18 ENCOUNTER — Other Ambulatory Visit: Payer: Self-pay | Admitting: Family

## 2017-05-18 DIAGNOSIS — R921 Mammographic calcification found on diagnostic imaging of breast: Secondary | ICD-10-CM

## 2017-07-05 ENCOUNTER — Ambulatory Visit
Admission: RE | Admit: 2017-07-05 | Discharge: 2017-07-05 | Disposition: A | Discharge status: Home / Self Care | Payer: Managed Care, Other (non HMO) | Source: Ambulatory Visit | Attending: Family | Admitting: Family

## 2017-07-05 DIAGNOSIS — R921 Mammographic calcification found on diagnostic imaging of breast: Secondary | ICD-10-CM

## 2018-02-08 ENCOUNTER — Other Ambulatory Visit: Payer: Self-pay | Admitting: Family

## 2018-02-08 DIAGNOSIS — R921 Mammographic calcification found on diagnostic imaging of breast: Secondary | ICD-10-CM

## 2018-02-08 DIAGNOSIS — Z09 Encounter for follow-up examination after completed treatment for conditions other than malignant neoplasm: Secondary | ICD-10-CM

## 2018-02-28 ENCOUNTER — Ambulatory Visit
Admission: RE | Admit: 2018-02-28 | Discharge: 2018-02-28 | Disposition: A | Discharge status: Home / Self Care | Payer: 59 | Source: Ambulatory Visit | Attending: Family | Admitting: Family

## 2018-02-28 DIAGNOSIS — R921 Mammographic calcification found on diagnostic imaging of breast: Secondary | ICD-10-CM

## 2018-02-28 DIAGNOSIS — Z09 Encounter for follow-up examination after completed treatment for conditions other than malignant neoplasm: Secondary | ICD-10-CM

## 2018-07-09 ENCOUNTER — Other Ambulatory Visit: Payer: Self-pay | Admitting: Family

## 2018-07-09 DIAGNOSIS — N6320 Unspecified lump in the left breast, unspecified quadrant: Secondary | ICD-10-CM

## 2018-07-16 ENCOUNTER — Ambulatory Visit
Admission: RE | Admit: 2018-07-16 | Discharge: 2018-07-16 | Disposition: A | Discharge status: Home / Self Care | Payer: 59 | Source: Ambulatory Visit | Attending: Family | Admitting: Family

## 2018-07-16 DIAGNOSIS — N6320 Unspecified lump in the left breast, unspecified quadrant: Secondary | ICD-10-CM

## 2018-07-30 DIAGNOSIS — R079 Chest pain, unspecified: Secondary | ICD-10-CM

## 2018-08-19 DIAGNOSIS — R079 Chest pain, unspecified: Secondary | ICD-10-CM

## 2019-12-11 ENCOUNTER — Other Ambulatory Visit: Payer: Self-pay | Admitting: Family

## 2019-12-11 DIAGNOSIS — E2839 Other primary ovarian failure: Secondary | ICD-10-CM

## 2019-12-11 DIAGNOSIS — R921 Mammographic calcification found on diagnostic imaging of breast: Secondary | ICD-10-CM

## 2020-03-04 ENCOUNTER — Ambulatory Visit
Admission: RE | Admit: 2020-03-04 | Discharge: 2020-03-04 | Disposition: A | Discharge status: Home / Self Care | Payer: No Typology Code available for payment source | Source: Ambulatory Visit | Attending: Family | Admitting: Family

## 2020-03-04 ENCOUNTER — Ambulatory Visit
Admission: RE | Admit: 2020-03-04 | Discharge: 2020-03-04 | Disposition: A | Discharge status: Home / Self Care | Payer: 59 | Source: Ambulatory Visit | Attending: Family | Admitting: Family

## 2020-03-04 ENCOUNTER — Other Ambulatory Visit: Payer: Self-pay

## 2020-03-04 DIAGNOSIS — E2839 Other primary ovarian failure: Secondary | ICD-10-CM

## 2020-03-04 DIAGNOSIS — R921 Mammographic calcification found on diagnostic imaging of breast: Secondary | ICD-10-CM

## 2020-12-17 ENCOUNTER — Other Ambulatory Visit: Payer: Self-pay

## 2020-12-17 ENCOUNTER — Ambulatory Visit: Payer: No Typology Code available for payment source | Admitting: Endocrinology

## 2020-12-17 ENCOUNTER — Encounter: Payer: Self-pay | Admitting: Endocrinology

## 2020-12-17 DIAGNOSIS — M810 Age-related osteoporosis without current pathological fracture: Secondary | ICD-10-CM

## 2020-12-17 NOTE — Progress Notes (Signed)
Subjective:    Patient ID: Connie [name], female    DOB: 08-31-1961, 60 y.o.   MRN: [id number]  HPI Pt is referred by Heide Scales, NP, for osteoporosis.  Pt was noted to have osteoporosis in approx 2016.  She took Fosamax x 6 mos, in 2019, then 1 dose of Reclast 12/21.  she has never had bony fracture.  She has no history of any of the following: multiple myeloma, renal dz, thyroid problems, steroids, alcoholism, Vit-D deficiency, and hyperparathyroidism.  She does not take heparin or anticonvulsants.  She had menopause at age 42.  She quit smoking in 2020.  She had hep-C successfully rx'ed in 2016.  She takes an uncertain dosage of Vit-D.   Past Medical History:  Diagnosis Date  . Cancer ([place])    breast  . 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 Hepatitis C    had the 12 week treatment-now gone  . Personal history of radiation therapy 2015  . PONV (postoperative nausea and vomiting)   . Psoriasis   . 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 Wears glasses     Past Surgical History:  Procedure Laterality Date  . BREAST LUMPECTOMY    . BREAST LUMPECTOMY WITH NEEDLE LOCALIZATION Left 09/10/2013   Procedure: BREAST LUMPECTOMY WITH NEEDLE LOCALIZATION (two);  Surgeon: Joyice Faster. Cornett, MD;  Location: [facility];  Service: General;  Laterality: Left;  . BREAST LUMPECTOMY WITH NEEDLE LOCALIZATION Left 11/11/2013   Procedure: BREAST LUMPECTOMY WITH NEEDLE LOCALIZATION;  Surgeon: Marcello Moores A. Cornett, MD;  Location: [facility];  Service: General;  Laterality: Left;  . BREAST SURGERY     lumpectomy on left  . caesaran   [id number]   x2  . CESAREAN SECTION     2  . EXCISION / BIOPSY BREAST / NIPPLE / DUCT Left 11/11/2013, 02/08/2013  . TUBAL LIGATION  2001   after last c-sec  . UPPER GI ENDOSCOPY      Social History   Socioeconomic History  . Marital status: Married    Spouse name: Not on file  . Number of children: Not on file  . Years of  education: Not on file  . Highest education level: Not on file  Occupational History  . Occupation:  Banker: [employer]    Comment:  [employer]   Tobacco Use  . Smoking status: Former Smoker    Packs/day: 0.50    Types: Cigarettes    Quit date: 08/30/2013    Years since quitting: 7.3  . Smokeless tobacco: Never Used  Substance and Sexual Activity  . Alcohol use: No  . Drug use: No  . Sexual activity: Not on file    Comment: just recently quit  Other Topics Concern  . Not on file  Social History Narrative  . Not on file   Social Determinants of Health   Financial Resource Strain: Not on file  Food Insecurity: Not on file  Transportation Needs: Not on file  Physical Activity: Not on file  Stress: Not on file  Social Connections: Not on file  Intimate Partner Violence: Not on file    Current Outpatient Medications on File Prior to Visit  Medication Sig Dispense Refill  . buPROPion (WELLBUTRIN SR) 100 MG 12 hr tablet Take 100 mg by mouth 2 (two) times daily.    Marland Kitchen escitalopram (LEXAPRO) 20 MG tablet Take 1 tablet (  20 mg total) by mouth daily.    . fluticasone (FLONASE) 50 MCG/ACT nasal spray Place 2 sprays into both nostrils daily. 16 g 0  . omeprazole (PRILOSEC) 20 MG capsule Take 1 capsule (20 mg total) by mouth daily.    . propranolol (INDERAL) 60 MG tablet Take 60 mg by mouth daily. Takes for esoph varies from having hep c-treated now    . STELARA 45 MG/0.5ML SOSY injection      No current facility-administered medications on file prior to visit.    No Known Allergies  Family History  Problem Relation Age of Onset  . Heart attack Father 26       Deceased  . Heart disease Father   . Congestive Heart Failure Mother 57       deceased  . Heart disease Mother   . Osteoporosis Neg Hx     BP 118/80 (BP Location: Right Arm, Patient Position: Sitting, Cuff Size: Normal)   Pulse 89   Ht _0  (1.676 m)   Wt 181 lb 6.4 oz (82.3  kg)   SpO2 96%   BMI 29.28 kg/m     Review of Systems denies weight loss, heartburn, cold intolerance, falls, muscle cramps, memory loss, and back pain.      Objective:   Physical Exam VITAL SIGNS:  See vs page GENERAL: no distress NECK: There is no palpable thyroid enlargement.  No thyroid nodule is palpable.  No palpable lymphadenopathy at the anterior neck.   SPINE: no kyphosis.   GAIT: normal and steady.     DEXA (2021): AP Spine L1-L4 is 0.872 g/cm2 with a T-score of -2.6.  TSH=1.6 Creat=0.8  I have reviewed outside records, and summarized: Pt was noted to have low BMD, and referred here.  HTN, anxiety, dyslipidemia, and GERD were also addressed.        Assessment & Plan:  Osteoporosis, new to me.  On rx.  As spine is most affected, we discussed raloxifene.  Pt says she'll consider  Patient Instructions  Let's recheck the bone density (after 03/04/21).  you will receive a phone call, about a day and time for an appointment.  If the osteoporosis is not better, you should consider adding a twice a year injection called "Prolia."  You should call your insurance, to see what your copay is for this.   In December, you will need to choose to redo the Reclast, or take a once a month pill Please come back for a follow-up appointment in 1 year.

## 2020-12-17 NOTE — Patient Instructions (Addendum)
Let's recheck the bone density (after 03/04/21).  you will receive a phone call, about a day and time for an appointment.  If the osteoporosis is not better, you should consider adding a twice a year injection called "Prolia."  You should call your insurance, to see what your copay is for this.   In December, you will need to choose to redo the Reclast, or take a once a month pill Please come back for a follow-up appointment in 1 year.

## 2021-01-31 ENCOUNTER — Other Ambulatory Visit: Payer: Self-pay | Admitting: Family

## 2021-01-31 DIAGNOSIS — Z1231 Encounter for screening mammogram for malignant neoplasm of breast: Secondary | ICD-10-CM

## 2021-03-28 ENCOUNTER — Inpatient Hospital Stay: Admission: RE | Admit: 2021-03-28 | Payer: No Typology Code available for payment source | Source: Ambulatory Visit

## 2021-04-02 ENCOUNTER — Ambulatory Visit
Admission: RE | Admit: 2021-04-02 | Discharge: 2021-04-02 | Disposition: A | Discharge status: Home / Self Care | Payer: No Typology Code available for payment source | Source: Ambulatory Visit | Attending: Family | Admitting: Family

## 2021-04-02 DIAGNOSIS — Z1231 Encounter for screening mammogram for malignant neoplasm of breast: Secondary | ICD-10-CM

## 2021-04-02 HISTORY — DX: Malignant neoplasm of unspecified site of unspecified female breast: C50.919

## 2021-12-19 ENCOUNTER — Ambulatory Visit: Payer: No Typology Code available for payment source | Admitting: Endocrinology

## 2022-04-13 ENCOUNTER — Other Ambulatory Visit: Payer: Self-pay | Admitting: Family

## 2022-04-13 DIAGNOSIS — Z1231 Encounter for screening mammogram for malignant neoplasm of breast: Secondary | ICD-10-CM

## 2022-05-03 ENCOUNTER — Other Ambulatory Visit: Payer: Self-pay | Admitting: Family

## 2022-05-03 DIAGNOSIS — N959 Unspecified menopausal and perimenopausal disorder: Secondary | ICD-10-CM

## 2022-05-17 ENCOUNTER — Ambulatory Visit
Admission: RE | Admit: 2022-05-17 | Discharge: 2022-05-17 | Disposition: A | Discharge status: Home / Self Care | Payer: No Typology Code available for payment source | Source: Ambulatory Visit | Attending: Family | Admitting: Family

## 2022-05-17 DIAGNOSIS — Z1231 Encounter for screening mammogram for malignant neoplasm of breast: Secondary | ICD-10-CM

## 2022-10-16 ENCOUNTER — Inpatient Hospital Stay: Admission: RE | Admit: 2022-10-16 | Payer: No Typology Code available for payment source | Source: Ambulatory Visit

## 2022-10-19 ENCOUNTER — Ambulatory Visit
Admission: RE | Admit: 2022-10-19 | Discharge: 2022-10-19 | Disposition: A | Discharge status: Home / Self Care | Payer: 59 | Source: Ambulatory Visit | Attending: Family | Admitting: Family

## 2022-10-19 DIAGNOSIS — N959 Unspecified menopausal and perimenopausal disorder: Secondary | ICD-10-CM

## 2023-06-19 ENCOUNTER — Other Ambulatory Visit: Payer: Self-pay | Admitting: Family

## 2023-06-19 DIAGNOSIS — Z1231 Encounter for screening mammogram for malignant neoplasm of breast: Secondary | ICD-10-CM

## 2023-07-12 ENCOUNTER — Ambulatory Visit
Admission: RE | Admit: 2023-07-12 | Discharge: 2023-07-12 | Disposition: A | Discharge status: Home / Self Care | Payer: 59 | Source: Ambulatory Visit | Attending: Family | Admitting: Family

## 2023-07-12 DIAGNOSIS — Z1231 Encounter for screening mammogram for malignant neoplasm of breast: Secondary | ICD-10-CM

## 2023-07-17 ENCOUNTER — Other Ambulatory Visit: Payer: Self-pay | Admitting: Family

## 2023-07-17 DIAGNOSIS — R928 Other abnormal and inconclusive findings on diagnostic imaging of breast: Secondary | ICD-10-CM

## 2023-07-21 ENCOUNTER — Ambulatory Visit
Admission: RE | Admit: 2023-07-21 | Discharge: 2023-07-21 | Disposition: A | Discharge status: Home / Self Care | Payer: 59 | Source: Ambulatory Visit | Attending: Family | Admitting: Family

## 2023-07-21 ENCOUNTER — Other Ambulatory Visit: Payer: Self-pay | Admitting: Family

## 2023-07-21 DIAGNOSIS — R928 Other abnormal and inconclusive findings on diagnostic imaging of breast: Secondary | ICD-10-CM

## 2023-07-21 DIAGNOSIS — R921 Mammographic calcification found on diagnostic imaging of breast: Secondary | ICD-10-CM

## 2023-07-24 ENCOUNTER — Ambulatory Visit
Admission: RE | Admit: 2023-07-24 | Discharge: 2023-07-24 | Disposition: A | Discharge status: Home / Self Care | Payer: 59 | Source: Ambulatory Visit | Attending: Family | Admitting: Family

## 2023-07-24 DIAGNOSIS — R921 Mammographic calcification found on diagnostic imaging of breast: Secondary | ICD-10-CM

## 2023-07-24 HISTORY — PX: BREAST BIOPSY: SHX20

## 2023-07-25 LAB — SURGICAL PATHOLOGY

## 2023-07-27 ENCOUNTER — Other Ambulatory Visit: Payer: Self-pay | Admitting: Medical Genetics

## 2023-07-27 DIAGNOSIS — Z006 Encounter for examination for normal comparison and control in clinical research program: Secondary | ICD-10-CM

## 2023-08-27 ENCOUNTER — Other Ambulatory Visit (HOSPITAL_COMMUNITY): Payer: Self-pay | Attending: Medical Genetics

## 2024-02-05 NOTE — Progress Notes (Addendum)
 02/06/2024 Connie [name] [id number] Jan 22, 1961  Referring provider: Marce Sensing, NP Primary GI doctor: Dr. 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 On prophylaxis propranolol 20 mg twice daily No symptoms Pending AB US  and labs, can consider keeping on prophylaxis if it is tolerated versus stopping and repeating EGD in 6 months to a year Hepatic encephalopathy:  Pt does not report any symptoms consistent with HE and no asterixis on exam.  Most recent HCC screening:    US  at her PCP, will request records, per patient shows no signs of cirrhosis Will get AFP Will suggest still getting US  every 6 months for [facility] at this time Provided general information to the patient: -Continue daily multivitamin -Recommended 30 minutes of aerobic and resistance exercise 3 days/week -Encouraged pt to increase protein intake  Obesity On Zepbound x 1 year, has lost weight Doing well, rare constipation Discussed GLP1 with the patient, mechanism of action. Gastroparesis diet given to the patient.  Patient should be instructed to hold this medications if dose falls within 7 days of endoscopic procedure, due to increased risk of retained gastric contents.  Screening colonoscopy  No family history of colon cancer No symptoms at this time Will schedule for colonoscopy at [facility]. We have discussed the risks of bleeding, infection, perforation, medication reactions, and remote risk of death associated with colonoscopy. All questions were answered and the patient acknowledges these risk and wishes to proceed.  GERD Had improved with weight loss  off medications  Psoriasis On Stelara x 5-6 years  I have reviewed the clinic note as  outlined by Santina Cull, PA and agree with the assessment, plan and medical decision making.  Ms. Pillard has a history of HCV genotype Ia status posttreatment with interferon+ribiviran+sofosbuvir x 12 weeks from January to April of 2014.  She has not seen a gastroenterologist in many years.  Recent abdominal ultrasound did not show stigmata of portal hypertension or evidence of cirrhosis by liver morphology.  Agree with obtaining outside records of ultrasound report.  Reasonable to update laboratory studies and to use that information to decide about need for esophageal variceal screening.  Continue HCC screening with AFP and ultrasound.  She is due for colonoscopy and this will be scheduled.  Prior records indicate that patient reports she has been vaccinated for hepatitis A and hepatitis B -can update serologies in the future to confirm appropriate vaccination.  Eugenia Hess, MD   Patient Care Team: Marce Sensing, NP as PCP - General  HISTORY OF PRESENT ILLNESS: 63 y.o. female  presents as a new patient for cirrhosis with history of chronic hepatitis C.   Previously seen by digestive health services in 2014, referred by [facility]. HCV genotype Ia s/p interferon+ribiviran+sofosbuvir x 12 weeks from January to April of 2014.  Had remote colonoscopy at [facility] before 2014.   For follow up of cirrhosis secondary to Hep C. Has history of the following complications from cirrhosis: esophageal varices and thrombocytopenia  Last EGD was 2013 with grade 1 varices has been on propranolol 20 mg twice daily.  Last HCC screen: Unknown Remote AFP and INR  Discussed the use of AI  scribe software for clinical note transcription with the patient, who gave verbal consent to proceed.  History of Present Illness   Connie [name] is a 63 year old female with cirrhosis due to hepatitis C who presents for follow-up on esophageal varices and liver health.  She has a history of cirrhosis  secondary to hepatitis C, treated in 2014. An endoscopy in 2013 revealed grade one esophageal varices, and she has been on propranolol since then. There has been no follow-up on the varices since starting propranolol. No gastrointestinal symptoms such as dysphagia, hematochezia, hematemesis, or anemia. No abdominal or leg swelling, melena, or heartburn. She had been taking omeprazole for reflux but discontinued it a week ago without recurrence of symptoms.  A liver ultrasound three weeks ago was normal with no hepatomegaly or steatosis, and a smooth liver surface. Her gallbladder was evaluated, revealing three gallstones. She does not consume alcohol.  She has been on Zepbound for nearly a year and has experienced significant weight loss, going from 215 pounds to 150 pounds. She also takes Stelara for psoriasis, which affects her elbows and joints, and has been on it for five to six years.  There is no family history of colon cancer or liver issues. She reports a history of constipation since having children, which may be related to pelvic floor dysfunction.       Wt Readings from Last 3 Encounters:  02/06/24 150 lb (68 kg)  12/17/20 181 lb 6.4 oz (82.3 kg)  09/13/16 180 lb (81.6 kg)   Discussed the use of AI scribe software for clinical note transcription with the patient, who gave verbal consent to proceed.  History of Present Illness   Connie [name] is a 63 year old female with cirrhosis due to hepatitis C who presents for follow-up on esophageal varices and liver health.  She has a history of cirrhosis secondary to hepatitis C, treated in 2014. An endoscopy in 2013 revealed grade one esophageal varices, and she has been on propranolol since then. There has been no follow-up on the varices since starting propranolol. No gastrointestinal symptoms such as dysphagia, hematochezia, hematemesis, or anemia. No abdominal or leg swelling, melena, or heartburn. She had been taking omeprazole for reflux  but discontinued it a week ago without recurrence of symptoms.  A liver ultrasound three weeks ago was normal with no hepatomegaly or steatosis, and a smooth liver surface. Her gallbladder was evaluated, revealing three gallstones. She does not consume alcohol.  She has been on Zepbound for nearly a year and has experienced significant weight loss, going from 215 pounds to 150 pounds. She also takes Stelara for psoriasis, which affects her elbows and joints, and has been on it for five to six years.  There is no family history of colon cancer or liver issues. She reports a history of constipation since having children, which may be related to pelvic floor dysfunction.      Hepatitis immunity status:  Per reports patient completed hepatitis A and B screening  Social history:  She  reports that she quit smoking about 10 years ago. Her smoking use included cigarettes. She has never used smokeless tobacco. She reports that she does not drink alcohol and does not use drugs.   RELEVANT GI HISTORY, LABS, IMAGING:  CBC    Component Value Date/Time   WBC 10.0 09/13/2016 0745   RBC 4.51 09/13/2016 0745   HGB 13.5 09/13/2016 0745   HGB 14.7 08/27/2013 0814   HCT 40.3 09/13/2016  0745   HCT 43.2 08/27/2013 0814   PLT 217 09/13/2016 0745   PLT 139 (L) 08/27/2013 0814   MCV 89.4 09/13/2016 0745   MCV 94.9 08/27/2013 0814   MCH 29.9 09/13/2016 0745   MCHC 33.5 09/13/2016 0745   RDW 13.1 09/13/2016 0745   RDW 13.0 08/27/2013 0814   LYMPHSABS 1.9 09/13/2016 0745   LYMPHSABS 2.4 08/27/2013 0814   MONOABS 1.0 09/13/2016 0745   MONOABS 0.7 08/27/2013 0814   EOSABS 0.3 09/13/2016 0745   EOSABS 0.2 08/27/2013 0814   BASOSABS 0.0 09/13/2016 0745   BASOSABS 0.0 08/27/2013 0814   No results for input(s): "HGB" in the last 8760 hours.  CMP     Component Value Date/Time   NA 140 09/13/2016 0745   NA 143 08/27/2013 0814   K 3.8 09/13/2016 0745   K 3.9 08/27/2013 0814   CL 107 09/13/2016 0745    CO2 23 09/13/2016 0745   CO2 24 08/27/2013 0814   GLUCOSE 107 (H) 09/13/2016 0745   GLUCOSE 92 08/27/2013 0814   BUN 18 09/13/2016 0745   BUN 17.4 08/27/2013 0814   CREATININE 0.79 09/13/2016 0745   CREATININE 0.68 05/29/2016 1141   CREATININE 0.8 08/27/2013 0814   CALCIUM 9.0 09/13/2016 0745   CALCIUM 9.7 08/27/2013 0814   PROT 7.6 09/13/2016 0745   PROT 7.1 08/27/2013 0814   ALBUMIN 4.1 09/13/2016 0745   ALBUMIN 3.6 08/27/2013 0814   AST 20 09/13/2016 0745   AST 35 (H) 08/27/2013 0814   ALT 19 09/13/2016 0745   ALT 42 08/27/2013 0814   ALKPHOS 70 09/13/2016 0745   ALKPHOS 89 08/27/2013 0814   BILITOT 0.6 09/13/2016 0745   BILITOT 0.68 08/27/2013 0814   GFRNONAA >60 09/13/2016 0745   GFRAA >60 09/13/2016 0745      Latest Ref Rng & Units 09/13/2016    7:45 AM 08/27/2013    8:14 AM 11/22/2011   11:06 AM  Hepatic Function  Total Protein 6.5 - 8.1 g/dL 7.6  7.1  7.2   Albumin 3.5 - 5.0 g/dL 4.1  3.6  3.9   AST 15 - 41 U/L 20  35  346   ALT 14 - 54 U/L 19  42  422   Alk Phosphatase 38 - 126 U/L 70  89  154   Total Bilirubin 0.3 - 1.2 mg/dL 0.6  2.95  1.5       Current Medications:    Current Outpatient Medications (Cardiovascular):    propranolol (INDERAL) 60 MG tablet, Take 60 mg by mouth daily. Takes for esoph varies from having hep c-treated now  Current Outpatient Medications (Respiratory):    fluticasone  (FLONASE ) 50 MCG/ACT nasal spray, Place 2 sprays into both nostrils daily.    Current Outpatient Medications (Other):    buPROPion (WELLBUTRIN SR) 100 MG 12 hr tablet, Take 100 mg by mouth 2 (two) times daily.   escitalopram (LEXAPRO) 20 MG tablet, Take 1 tablet (20 mg total) by mouth daily.   Na Sulfate-K Sulfate-Mg Sulfate concentrate (SUPREP) 17.5-3.13-1.6 GM/177ML SOLN, Take 1 kit (354 mLs total) by mouth once for 1 dose.   omeprazole (PRILOSEC) 20 MG capsule, Take 1 capsule (20 mg total) by mouth daily.   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 Family History:  Her family history includes Congestive Heart Failure (age of onset: 70) in her mother; Heart attack (age of onset: 33) in her father; Heart disease in her father and mother.  REVIEW OF SYSTEMS  : All other systems reviewed and negative except where noted in the History of Present Illness.  PHYSICAL EXAM: BP 116/68   Pulse 80   Ht 5\' 6"  (1.676 m)   Wt 150 lb (68 kg)   BMI 24.21 kg/m  General :  Alert, well developed female in no acute distress Head:  Normocephalic and atraumatic. Eyes :  sclerae anicteric,conjunctive pink  Heart:  regular rate and rhythm, no murmurs or gallops Pulm:  Clear anteriorly; no wheezing Abdomen:   Soft, Flat AB, skin exam normal, Normal bowel sounds. no tenderness . Without guarding and Without rebound, without organomegaly. no  fluid wave, no  shifting dullness.  Extremities:   Without edema. Msk:  Symmetrical without gross deformities. Peripheral pulses intact.  Neurologic: Alert and  oriented x4;  grossly normal neurologically. without asterixis or clonus.  Skin:   without jaundice. no palmar erythema or  spider angioma.   Psychiatric:  Demonstrates good judgement and reason without abnormal affect or behaviors.    Edmonia Gottron, PA-C 3:29 PM

## 2024-02-06 ENCOUNTER — Ambulatory Visit (INDEPENDENT_AMBULATORY_CARE_PROVIDER_SITE_OTHER): Admitting: Physician Assistant

## 2024-02-06 ENCOUNTER — Ambulatory Visit: Payer: Self-pay | Admitting: Physician Assistant

## 2024-02-06 ENCOUNTER — Other Ambulatory Visit (INDEPENDENT_AMBULATORY_CARE_PROVIDER_SITE_OTHER)

## 2024-02-06 ENCOUNTER — Encounter: Payer: Self-pay | Admitting: Physician Assistant

## 2024-02-06 VITALS — BP 116/68 | HR 80 | Ht 66.0 in | Wt 150.0 lb

## 2024-02-06 DIAGNOSIS — Z1211 Encounter for screening for malignant neoplasm of colon: Secondary | ICD-10-CM

## 2024-02-06 DIAGNOSIS — B182 Chronic viral hepatitis C: Secondary | ICD-10-CM

## 2024-02-06 DIAGNOSIS — K7030 Alcoholic cirrhosis of liver without ascites: Secondary | ICD-10-CM

## 2024-02-06 DIAGNOSIS — Z79899 Other long term (current) drug therapy: Secondary | ICD-10-CM

## 2024-02-06 DIAGNOSIS — E669 Obesity, unspecified: Secondary | ICD-10-CM

## 2024-02-06 DIAGNOSIS — K219 Gastro-esophageal reflux disease without esophagitis: Secondary | ICD-10-CM

## 2024-02-06 DIAGNOSIS — L409 Psoriasis, unspecified: Secondary | ICD-10-CM | POA: Diagnosis not present

## 2024-02-06 DIAGNOSIS — K7460 Unspecified cirrhosis of liver: Secondary | ICD-10-CM

## 2024-02-06 LAB — BASIC METABOLIC PANEL WITH GFR
BUN: 18 mg/dL (ref 6–23)
CO2: 29 meq/L (ref 19–32)
Calcium: 9.5 mg/dL (ref 8.4–10.5)
Chloride: 103 meq/L (ref 96–112)
Creatinine, Ser: 0.77 mg/dL (ref 0.40–1.20)
GFR: 82.32 mL/min (ref >60.00)
Glucose, Bld: 85 mg/dL (ref 70–99)
Potassium: 3.7 meq/L (ref 3.5–5.1)
Sodium: 140 meq/L (ref 135–145)

## 2024-02-06 LAB — PROTIME-INR
INR: 1.1 ratio — ABNORMAL HIGH (ref 0.8–1.0)
Prothrombin Time: 11.6 s (ref 9.6–13.1)

## 2024-02-06 LAB — CBC WITH DIFFERENTIAL/PLATELET
Basophils Absolute: 0.1 10*3/uL (ref 0.0–0.1)
Basophils Relative: 0.7 % (ref 0.0–3.0)
Eosinophils Absolute: 0.3 10*3/uL (ref 0.0–0.7)
Eosinophils Relative: 3.6 % (ref 0.0–5.0)
HCT: 38.9 % (ref 36.0–46.0)
Hemoglobin: 12.9 g/dL (ref 12.0–15.0)
Lymphocytes Relative: 37.6 % (ref 12.0–46.0)
Lymphs Abs: 2.9 10*3/uL (ref 0.7–4.0)
MCHC: 33.3 g/dL (ref 30.0–36.0)
MCV: 90.1 fl (ref 78.0–100.0)
Monocytes Absolute: 0.7 10*3/uL (ref 0.1–1.0)
Monocytes Relative: 8.7 % (ref 3.0–12.0)
Neutro Abs: 3.8 10*3/uL (ref 1.4–7.7)
Neutrophils Relative %: 49.4 % (ref 43.0–77.0)
Platelets: 237 10*3/uL (ref 150.0–400.0)
RBC: 4.32 Mil/uL (ref 3.87–5.11)
RDW: 13.1 % (ref 11.5–15.5)
WBC: 7.7 10*3/uL (ref 4.0–10.5)

## 2024-02-06 LAB — HEPATIC FUNCTION PANEL
ALT: 17 U/L (ref 0–35)
AST: 22 U/L (ref 0–37)
Albumin: 4.4 g/dL (ref 3.5–5.2)
Alkaline Phosphatase: 37 U/L — ABNORMAL LOW (ref 39–117)
Bilirubin, Direct: 0.1 mg/dL (ref 0.0–0.3)
Total Bilirubin: 0.4 mg/dL (ref 0.2–1.2)
Total Protein: 7 g/dL (ref 6.0–8.3)

## 2024-02-06 MED ORDER — NA SULFATE-K SULFATE-MG SULF 17.5-3.13-1.6 GM/177ML PO SOLN: 1.0000 | Freq: Once | ORAL | 0 refills | Status: AC

## 2024-02-06 NOTE — Patient Instructions (Addendum)
 Your provider has requested that you go to the basement level for lab work before leaving today. Press "B" on the elevator. The lab is located at the first door on the left as you exit the elevator.  Here some information about pelvic floor dysfunction. This may be contributing to some of your symptoms. We will continue with our evaluation but I do want you to consider adding on fiber supplement with low-dose MiraLAX daily. We could also refer to pelvic floor physical therapy.  Miralax is an osmotic laxative.  It only brings more water into the stool.  This is safe to take daily.  Can take up to 17 gram of miralax twice a day.  Mix with juice or coffee.  Start 1 capful at night for 3-4 days and reassess your response in 3-4 days.  You can increase and decrease the dose based on your response.  Remember, it can take up to 3-4 days to take effect OR for the effects to wear off.   I often pair this with benefiber in the morning to help assure the stool is not too loose.     Pelvic Floor Dysfunction, Female Pelvic floor dysfunction (PFD) is a condition that results when the group of muscles and connective tissues that support the organs in the pelvis (pelvic floor muscles) do not work well. These muscles and their connections form a sling that supports the colon and bladder. In women, they also support the uterus. PFD causes pelvic floor muscles to be too weak, too tight, or both. In PFD, muscle movements are not coordinated. This may cause bowel or bladder problems. It may also cause pain. What are the causes? This condition may be caused by an injury to the pelvic area or by a weakening of pelvic muscles. This often results from pregnancy and childbirth or other types of strain. In many cases, the exact cause is not known. What increases the risk? The following factors may make you more likely to develop this condition: Having chronic bladder tissue inflammation (interstitial  cystitis). Being an older person. Being overweight. History of radiation treatment for cancer in the pelvic region. Previous pelvic surgery, such as removal of the uterus (hysterectomy). What are the signs or symptoms? Symptoms of this condition vary and may include: Bladder symptoms, such as: Trouble starting urination and emptying the bladder. Frequent urinary tract infections. Leaking urine when coughing, laughing, or exercising (stress incontinence). Having to pass urine urgently or frequently. Pain when passing urine. Bowel symptoms, such as: Constipation. Urgent or frequent bowel movements. Incomplete bowel movements. Painful bowel movements. Leaking stool or gas. Unexplained genital or rectal pain. Genital or rectal muscle spasms. Low back pain. Other symptoms may include: A heavy, full, or aching feeling in the vagina. A bulge that protrudes into the vagina. Pain during or after sex. How is this diagnosed? This condition may be diagnosed based on: Your symptoms and medical history. A physical exam. During the exam, your health care provider may check your pelvic muscles for tightness, spasm, pain, or weakness. This may include a rectal exam and a pelvic exam. In some cases, you may have diagnostic tests, such as: Electrical muscle function tests. Urine flow testing. X-ray tests of bowel function. Ultrasound of the pelvic organs. How is this treated? Treatment for this condition depends on the symptoms. Treatment options include: Physical therapy. This may include Kegel exercises to help relax or strengthen the pelvic floor muscles. Biofeedback. This type of therapy provides feedback on how tight  your pelvic floor muscles are so that you can learn to control them. Internal or external massage therapy. A treatment that involves electrical stimulation of the pelvic floor muscles to help control pain (transcutaneous electrical nerve stimulation, or TENS). Sound wave  therapy (ultrasound) to reduce muscle spasms. Medicines, such as: Muscle relaxants. Bladder control medicines. Surgery to reconstruct or support pelvic floor muscles may be an option if other treatments do not help. Follow these instructions at home: Activity Do your usual activities as told by your health care provider. Ask your health care provider if you should modify any activities. Do pelvic floor strengthening or relaxing exercises at home as told by your physical therapist. Lifestyle Maintain a healthy weight. Eat foods that are high in fiber, such as beans, whole grains, and fresh fruits and vegetables. Limit foods that are high in fat and processed sugars, such as fried or sweet foods. Manage stress with relaxation techniques such as yoga or meditation. General instructions If you have problems with leakage: Use absorbable pads or wear padded underwear. Wash frequently with mild soap. Keep your genital and anal area as clean and dry as possible. Ask your health care provider if you should try a barrier cream to prevent skin irritation. Take warm baths to relieve pelvic muscle tension or spasms. Take over-the-counter and prescription medicines only as told by your health care provider. Keep all follow-up visits. How is this prevented? The cause of PFD is not always known, but there are a few things you can do to reduce the risk of developing this condition, including: Staying at a healthy weight. Getting regular exercise. Managing stress. Contact a health care provider if: Your symptoms are not improving with home care. You have signs or symptoms of PFD that get worse at home. You develop new signs or symptoms. You have signs of a urinary tract infection, such as: Fever. Chills. Increased urinary frequency. A burning feeling when urinating. You have not had a bowel movement in 3 days (constipation). Summary Pelvic floor dysfunction results when the muscles and  connective tissues in your pelvic floor do not work well. These muscles and their connections form a sling that supports your colon and bladder. In women, they also support the uterus. PFD may be caused by an injury to the pelvic area or by a weakening of pelvic muscles. PFD causes pelvic floor muscles to be too weak, too tight, or a combination of both. Symptoms may vary from person to person. In most cases, PFD can be treated with physical therapies and medicines. Surgery may be an option if other treatments do not help. This information is not intended to replace advice given to you by your health care provider. Make sure you discuss any questions you have with your health care provider. Document Revised: 01/12/2021 Document Reviewed: 01/12/2021 Elsevier Patient Education  2022 [employer].   Due to recent changes in healthcare laws, you may see the results of your imaging and laboratory studies on MyChart before your provider has had a chance to review them.  We understand that in some cases there may be results that are confusing or concerning to you. Not all laboratory results come back in the same time frame and the provider may be waiting for multiple results in order to interpret others.  Please give us  48 hours in order for your provider to thoroughly review all the results before contacting the office for clarification of your results.

## 2024-02-08 LAB — AFP TUMOR MARKER: AFP-Tumor Marker: 2.7 ng/mL

## 2024-02-13 NOTE — Telephone Encounter (Addendum)
 RUQ US  reminder in epic for October 07/14/24

## 2024-02-13 NOTE — Telephone Encounter (Signed)
 01/09/2024 right upper quadrant ultrasound shows normal-sized liver with no evidence of cyst or masses cholelithiasis with shadowing mobile gallstones largest 1.1 cm no biliary ductal dilation.  No evidence of ascites

## 2024-03-18 NOTE — Progress Notes (Unsigned)
 [facility] History and Physical   Primary Care Physician:  Silvano Angeline FALCON, NP   Reason for Procedure:  Colorectal cancer screening  Plan:    Screening colonoscopy     HPI: Connie [name] is a 63 y.o. female undergoing screening colonoscopy for colorectal cancer screening.  Colonoscopy in 2015 showed 1 hyperplastic polyp.  No family history of colorectal cancer or polyps.  Patient denies current symptoms of change in bowel habits or rectal bleeding.   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 Cornett, MD;  Location: [facility];  Service: General;  Laterality: Left;   BREAST LUMPECTOMY WITH NEEDLE LOCALIZATION Left 11/11/2013   Procedure: BREAST LUMPECTOMY WITH NEEDLE LOCALIZATION;  Surgeon: Debby A. Cornett, MD;  Location: [facility];  Service: General;  Laterality: Left;   BREAST SURGERY     lumpectomy on left   caesaran   8010,7998   x2   CESAREAN SECTION     2   EXCISION / BIOPSY BREAST / NIPPLE / DUCT Left 11/11/2013, 02/08/2013   TUBAL LIGATION  2001   after last c-sec   UPPER GI ENDOSCOPY      Prior to Admission medications   Medication Sig Start Date End Date Taking? Authorizing Provider  buPROPion (WELLBUTRIN SR) 100 MG 12 hr tablet Take 100 mg  by mouth 2 (two) times daily.    [provider]  escitalopram (LEXAPRO) 20 MG tablet Take 1 tablet (20 mg total) by mouth daily. 01/12/15   Magrinat, Sandria JAYSON, MD  fluticasone  (FLONASE ) 50 MCG/ACT nasal spray Place 2 sprays into both nostrils daily. 08/07/16   Starla Grate D, PA-C  omeprazole (PRILOSEC) 20 MG capsule Take 1 capsule (20 mg total) by mouth daily. 01/12/15   Magrinat, Gustav C, MD  propranolol (INDERAL) 60 MG tablet Take 60 mg by mouth daily. Takes for esoph varies from having hep c-treated now    [provider]  ABBE 45 MG/0.5ML SOSY injection  08/02/16   [provider]    Current Outpatient Medications  Medication Sig Dispense Refill   buPROPion (WELLBUTRIN SR) 100 MG 12 hr tablet Take 100 mg by mouth 2 (two) times daily.     escitalopram (LEXAPRO) 20 MG tablet Take 1 tablet (20 mg total) by mouth daily.     fluticasone  (FLONASE ) 50 MCG/ACT nasal spray Place 2 sprays into both nostrils daily. 16 g 0   omeprazole (PRILOSEC) 20 MG capsule Take 1 capsule (20 mg total) by mouth daily.     propranolol (INDERAL) 60 MG tablet Take 60 mg by mouth daily. Takes for esoph varies from having hep c-treated now  STELARA 45 MG/0.5ML SOSY injection      No current facility-administered medications for this visit.    Allergies as of 03/19/2024   (No Known Allergies)    Family History  Problem Relation Age of Onset   Congestive Heart Failure Mother 10       deceased   Heart disease Mother    Heart attack Father 73       Deceased   Heart disease Father    Liver disease Neg Hx    Colon cancer Neg Hx    Esophageal cancer Neg Hx     Social History   Socioeconomic History   Marital status: Married    Spouse name: Not on file   Number of children: 2   Years of education: Not on file   Highest education level: Not on file  Occupational History   Occupation:  Haematologist: [employer]    Comment:  [employer]    Tobacco Use   Smoking status: Former    Current packs/day: 0.00    Types: Cigarettes    Quit date: 08/30/2013    Years since quitting: 10.5   Smokeless tobacco: Never  Vaping Use   Vaping status: Never Used  Substance and Sexual Activity   Alcohol use: No   Drug use: No   Sexual activity: Not on file    Comment: just recently quit  Other Topics Concern   Not on file  Social History Narrative   Not on file   Social Drivers of Health   Financial Resource Strain: Not on file  Food Insecurity: Not on file  Transportation Needs: Not on file  Physical Activity: Not on file  Stress: Not on file  Social Connections: Not on file  Intimate Partner Violence: Not on file    Review of Systems:  All other review of systems negative except as mentioned in the HPI.  Physical Exam: Vital signs There were no vitals taken for this visit.  General:   Alert,  Well-developed, well-nourished, pleasant and cooperative in NAD Airway:  Mallampati  Lungs:  Clear throughout to auscultation.   Heart:  Regular rate and rhythm; no murmurs, clicks, rubs,  or gallops. Abdomen:  Soft, nontender and nondistended. Normal bowel sounds.   Neuro/Psych:  Normal mood and affect. A and O x 3  Inocente Hausen, MD [facility]

## 2024-03-19 ENCOUNTER — Ambulatory Visit: Admitting: Pediatrics

## 2024-03-19 ENCOUNTER — Encounter: Payer: Self-pay | Admitting: Pediatrics

## 2024-03-19 VITALS — BP 136/59 | HR 69 | Temp 98.1°F | Resp 13 | Ht 66.0 in | Wt 148.6 lb

## 2024-03-19 DIAGNOSIS — K648 Other hemorrhoids: Secondary | ICD-10-CM

## 2024-03-19 DIAGNOSIS — K644 Residual hemorrhoidal skin tags: Secondary | ICD-10-CM | POA: Diagnosis not present

## 2024-03-19 DIAGNOSIS — D123 Benign neoplasm of transverse colon: Secondary | ICD-10-CM

## 2024-03-19 DIAGNOSIS — Z1211 Encounter for screening for malignant neoplasm of colon: Secondary | ICD-10-CM

## 2024-03-19 DIAGNOSIS — K635 Polyp of colon: Secondary | ICD-10-CM

## 2024-03-19 DIAGNOSIS — D124 Benign neoplasm of descending colon: Secondary | ICD-10-CM

## 2024-03-19 DIAGNOSIS — D125 Benign neoplasm of sigmoid colon: Secondary | ICD-10-CM

## 2024-03-19 MED ORDER — SODIUM CHLORIDE 0.9 % IV SOLN: 500.0000 mL | Freq: Once | INTRAVENOUS | Status: DC

## 2024-03-19 NOTE — Progress Notes (Signed)
 Pt's states no medical or surgical changes since previsit or office visit.

## 2024-03-19 NOTE — Patient Instructions (Signed)

## 2024-03-19 NOTE — Progress Notes (Signed)
 Pt sedate, gd SR's, VSS, report to RN

## 2024-03-19 NOTE — Progress Notes (Signed)
 9191 - Right AC IV infiltrated during induction. 22g iv started in right hand x1 attempt

## 2024-03-19 NOTE — Op Note (Signed)
 [facility] Patient Name: Connie [name] Procedure Date: 03/19/2024 7:05 AM MRN: [id number] Endoscopist: Inocente Hausen , MD, [id number] Age: 63 Referring MD:  Date of Birth: 18-Apr-1961 Gender: Female Account #: [id number] Procedure:                Colonoscopy Indications:              Screening for colorectal malignant neoplasm, Last                            colonoscopy: 2015 Medicines:                Monitored Anesthesia Care Procedure:                Pre-Anesthesia Assessment:                           - Prior to the procedure, a History and Physical                            was performed, and patient medications and                            allergies were reviewed. The patient's tolerance of                            previous anesthesia was also reviewed. The risks                            and benefits of the procedure and the sedation                            options and risks were discussed with the patient.                            All questions were answered, and informed consent                            was obtained. Prior Anticoagulants: The patient has                            taken no anticoagulant or antiplatelet agents. ASA                            Grade Assessment: II - A patient with mild systemic                            disease. After reviewing the risks and benefits,                            the patient was deemed in satisfactory condition to                            undergo the procedure.  After obtaining informed consent, the colonoscope                            was passed under direct vision. Throughout the                            procedure, the patient's blood pressure, pulse, and                            oxygen saturations were monitored continuously. The                            [id number]-HQ190L Colonoscope [id number] was introduced                            through the anus and advanced to the cecum,                             identified by appendiceal orifice and ileocecal                            valve. The colonoscopy was performed without                            difficulty. The patient tolerated the procedure                            well. The quality of the bowel preparation was                            good. The ileocecal valve, appendiceal orifice, and                            rectum were photographed. Scope In: 8:11:23 AM Scope Out: 8:34:29 AM Scope Withdrawal Time: 0 hours 16 minutes 8 seconds  Total Procedure Duration: 0 hours 23 minutes 6 seconds  Findings:                 Hemorrhoids were found on perianal exam.                           The digital rectal exam was normal. Pertinent                            negatives include normal sphincter tone and no                            palpable rectal lesions.                           Two sessile polyps were found in the descending                            colon and transverse colon. The polyps were 5 to 6  mm in size. These polyps were removed with a cold                            snare. Resection and retrieval were complete.                           A 3 mm polyp was found in the sigmoid colon. The                            polyp was sessile. The polyp was removed with a                            cold biopsy forceps. Resection and retrieval were                            complete.                           Internal hemorrhoids were found during retroflexion. Complications:            No immediate complications. Estimated blood loss:                            Minimal. Estimated Blood Loss:     Estimated blood loss was minimal. Impression:               - Hemorrhoids found on perianal exam.                           - Two 5 to 6 mm polyps in the descending colon and                            in the transverse colon, removed with a cold snare.                            Resected and  retrieved.                           - One 3 mm polyp in the sigmoid colon, removed with                            a cold biopsy forceps. Resected and retrieved.                           - Internal hemorrhoids. Recommendation:           - Discharge patient to home (ambulatory).                           - Await pathology results.                           - Repeat colonoscopy for surveillance based on  pathology results.                           - The findings and recommendations were discussed                            with the patient's family.                           - Return to referring physician.                           - Patient has a contact number available for                            emergencies. The signs and symptoms of potential                            delayed complications were discussed with the                            patient. Return to normal activities tomorrow.                            Written discharge instructions were provided to the                            patient. Inocente Hausen, MD 03/19/2024 8:38:04 AM This report has been signed electronically.

## 2024-03-20 ENCOUNTER — Telehealth: Payer: Self-pay

## 2024-03-20 NOTE — Telephone Encounter (Signed)
 Unable to reach pt.  Left HIPAA compliant voicemail.

## 2024-03-25 LAB — SURGICAL PATHOLOGY

## 2024-03-26 ENCOUNTER — Ambulatory Visit: Payer: Self-pay | Admitting: Pediatrics

## 2024-07-14 ENCOUNTER — Telehealth: Payer: Self-pay

## 2024-07-14 NOTE — Telephone Encounter (Signed)
-----   Message from Nurse C[place] B sent at 02/13/2024 11:27 AM EDT ----- Regarding: 62-month US  RUQ US  due - need to order - cirrhosis, chronic hep C Connie [name]

## 2024-07-16 ENCOUNTER — Other Ambulatory Visit: Payer: Self-pay | Admitting: Medical Genetics

## 2024-07-16 DIAGNOSIS — Z006 Encounter for examination for normal comparison and control in clinical research program: Secondary | ICD-10-CM

## 2024-07-31 ENCOUNTER — Other Ambulatory Visit: Payer: Self-pay | Admitting: Family

## 2024-07-31 DIAGNOSIS — Z1231 Encounter for screening mammogram for malignant neoplasm of breast: Secondary | ICD-10-CM

## 2024-08-08 ENCOUNTER — Other Ambulatory Visit: Payer: Self-pay | Admitting: Family

## 2024-08-08 ENCOUNTER — Inpatient Hospital Stay: Admission: RE | Admit: 2024-08-08

## 2024-08-08 DIAGNOSIS — N6324 Unspecified lump in the left breast, lower inner quadrant: Secondary | ICD-10-CM

## 2024-08-11 ENCOUNTER — Encounter (HOSPITAL_BASED_OUTPATIENT_CLINIC_OR_DEPARTMENT_OTHER): Payer: Self-pay | Admitting: Family

## 2024-09-16 ENCOUNTER — Ambulatory Visit
Admission: RE | Admit: 2024-09-16 | Discharge: 2024-09-16 | Disposition: A | Discharge status: Home / Self Care | Source: Ambulatory Visit | Attending: Family | Admitting: Family

## 2024-09-16 DIAGNOSIS — N6324 Unspecified lump in the left breast, lower inner quadrant: Secondary | ICD-10-CM
